# Patient Record
Sex: Male | Born: 1940 | Race: White | Hispanic: No | Marital: Married | Smoking: Former smoker
Health system: Southern US, Community
[De-identification: ages and names within clinical notes are randomized; demographics above are authoritative.]

## PROBLEM LIST (undated history)

## (undated) DIAGNOSIS — U071 COVID-19: Secondary | ICD-10-CM

## (undated) DIAGNOSIS — N289 Disorder of kidney and ureter, unspecified: Secondary | ICD-10-CM

## (undated) DIAGNOSIS — I4891 Unspecified atrial fibrillation: Secondary | ICD-10-CM

## (undated) DIAGNOSIS — K469 Unspecified abdominal hernia without obstruction or gangrene: Secondary | ICD-10-CM

## (undated) HISTORY — PX: BACK SURGERY: SHX140

## (undated) HISTORY — PX: HERNIA REPAIR: SHX51

## (undated) HISTORY — PX: APPENDECTOMY: SHX54

## (undated) HISTORY — PX: SHOULDER OPEN ROTATOR CUFF REPAIR: SHX2407

---

## 2013-11-06 DIAGNOSIS — N183 Chronic kidney disease, stage 3 unspecified: Secondary | ICD-10-CM | POA: Diagnosis present

## 2013-11-06 DIAGNOSIS — Q619 Cystic kidney disease, unspecified: Secondary | ICD-10-CM | POA: Insufficient documentation

## 2016-02-21 DIAGNOSIS — I480 Paroxysmal atrial fibrillation: Secondary | ICD-10-CM | POA: Insufficient documentation

## 2017-04-26 DIAGNOSIS — E785 Hyperlipidemia, unspecified: Secondary | ICD-10-CM | POA: Insufficient documentation

## 2017-04-26 DIAGNOSIS — I10 Essential (primary) hypertension: Secondary | ICD-10-CM | POA: Insufficient documentation

## 2018-08-05 ENCOUNTER — Other Ambulatory Visit: Payer: Self-pay | Admitting: Optometrist

## 2018-08-05 DIAGNOSIS — H539 Unspecified visual disturbance: Secondary | ICD-10-CM

## 2019-07-17 DIAGNOSIS — C61 Malignant neoplasm of prostate: Secondary | ICD-10-CM | POA: Insufficient documentation

## 2020-02-21 DIAGNOSIS — I2693 Single subsegmental pulmonary embolism without acute cor pulmonale: Secondary | ICD-10-CM | POA: Diagnosis present

## 2020-03-04 ENCOUNTER — Emergency Department (HOSPITAL_COMMUNITY): Admission: EM | Admit: 2020-03-04 | Payer: Self-pay

## 2020-03-04 ENCOUNTER — Other Ambulatory Visit (HOSPITAL_COMMUNITY): Payer: Medicare HMO

## 2020-03-04 ENCOUNTER — Inpatient Hospital Stay
Admission: EM | Admit: 2020-03-04 | Discharge: 2020-03-22 | Disposition: A | Discharge status: Skilled Nursing Facility | Payer: Medicare HMO | Source: Other Acute Inpatient Hospital | Attending: Internal Medicine | Admitting: Internal Medicine

## 2020-03-04 DIAGNOSIS — J189 Pneumonia, unspecified organism: Secondary | ICD-10-CM

## 2020-03-04 DIAGNOSIS — J9690 Respiratory failure, unspecified, unspecified whether with hypoxia or hypercapnia: Secondary | ICD-10-CM

## 2020-03-04 DIAGNOSIS — U071 COVID-19: Secondary | ICD-10-CM

## 2020-03-04 LAB — COMPREHENSIVE METABOLIC PANEL
ALT: 187 U/L — ABNORMAL HIGH (ref 0–44)
AST: 91 U/L — ABNORMAL HIGH (ref 15–41)
Albumin: 2.3 g/dL — ABNORMAL LOW (ref 3.5–5.0)
Alkaline Phosphatase: 263 U/L — ABNORMAL HIGH (ref 38–126)
Anion gap: 10 (ref 5–15)
BUN: 16 mg/dL (ref 8–23)
CO2: 30 mmol/L (ref 22–32)
Calcium: 9.4 mg/dL (ref 8.9–10.3)
Chloride: 95 mmol/L — ABNORMAL LOW (ref 98–111)
Creatinine, Ser: 1.12 mg/dL (ref 0.61–1.24)
GFR calc Af Amer: >60 mL/min (ref >60)
GFR calc non Af Amer: >60 mL/min (ref >60)
Glucose, Bld: 115 mg/dL — ABNORMAL HIGH (ref 70–99)
Potassium: 4.3 mmol/L (ref 3.5–5.1)
Sodium: 135 mmol/L (ref 135–145)
Total Bilirubin: 0.5 mg/dL (ref 0.3–1.2)
Total Protein: 6.5 g/dL (ref 6.5–8.1)

## 2020-03-04 LAB — CBC WITH DIFFERENTIAL/PLATELET
Abs Immature Granulocytes: 0.47 10*3/uL — ABNORMAL HIGH (ref 0.00–0.07)
Basophils Absolute: 0.1 10*3/uL (ref 0.0–0.1)
Basophils Relative: 1 %
Eosinophils Absolute: 0 10*3/uL (ref 0.0–0.5)
Eosinophils Relative: 0 %
HCT: 40.8 % (ref 39.0–52.0)
Hemoglobin: 13.3 g/dL (ref 13.0–17.0)
Immature Granulocytes: 4 %
Lymphocytes Relative: 5 %
Lymphs Abs: 0.6 10*3/uL — ABNORMAL LOW (ref 0.7–4.0)
MCH: 32 pg (ref 26.0–34.0)
MCHC: 32.6 g/dL (ref 30.0–36.0)
MCV: 98.1 fL (ref 80.0–100.0)
Monocytes Absolute: 1.1 10*3/uL — ABNORMAL HIGH (ref 0.1–1.0)
Monocytes Relative: 8 %
Neutro Abs: 11 10*3/uL — ABNORMAL HIGH (ref 1.7–7.7)
Neutrophils Relative %: 82 %
Platelets: 217 10*3/uL (ref 150–400)
RBC: 4.16 MIL/uL — ABNORMAL LOW (ref 4.22–5.81)
RDW: 13.2 % (ref 11.5–15.5)
WBC: 13.3 10*3/uL — ABNORMAL HIGH (ref 4.0–10.5)
nRBC: 0 % (ref 0.0–0.2)

## 2020-03-04 LAB — MAGNESIUM: Magnesium: 2 mg/dL (ref 1.7–2.4)

## 2020-03-04 LAB — PROTIME-INR
INR: 1.1 (ref 0.8–1.2)
Prothrombin Time: 13.5 seconds (ref 11.4–15.2)

## 2020-03-04 LAB — PHOSPHORUS: Phosphorus: 3.2 mg/dL (ref 2.5–4.6)

## 2020-03-05 LAB — URINALYSIS, ROUTINE W REFLEX MICROSCOPIC
Bilirubin Urine: NEGATIVE
Glucose, UA: NEGATIVE mg/dL
Ketones, ur: NEGATIVE mg/dL
Nitrite: NEGATIVE
Protein, ur: NEGATIVE mg/dL
RBC / HPF: >50 RBC/hpf — ABNORMAL HIGH (ref 0–5)
Specific Gravity, Urine: 1.008 (ref 1.005–1.030)
WBC, UA: >50 WBC/hpf — ABNORMAL HIGH (ref 0–5)
pH: 6 (ref 5.0–8.0)

## 2020-03-06 LAB — EXPECTORATED SPUTUM ASSESSMENT W GRAM STAIN, RFLX TO RESP C

## 2020-03-06 LAB — BLOOD GAS, ARTERIAL
Acid-Base Excess: 2.1 mmol/L — ABNORMAL HIGH (ref 0.0–2.0)
Bicarbonate: 25.6 mmol/L (ref 20.0–28.0)
FIO2: 36
O2 Saturation: 98.5 %
Patient temperature: 36.4
pCO2 arterial: 35 mmHg (ref 32.0–48.0)
pH, Arterial: 7.475 — ABNORMAL HIGH (ref 7.350–7.450)
pO2, Arterial: 127 mmHg — ABNORMAL HIGH (ref 83.0–108.0)

## 2020-03-07 LAB — CBC
HCT: 38.6 % — ABNORMAL LOW (ref 39.0–52.0)
Hemoglobin: 12.9 g/dL — ABNORMAL LOW (ref 13.0–17.0)
MCH: 32.7 pg (ref 26.0–34.0)
MCHC: 33.4 g/dL (ref 30.0–36.0)
MCV: 97.7 fL (ref 80.0–100.0)
Platelets: 228 10*3/uL (ref 150–400)
RBC: 3.95 MIL/uL — ABNORMAL LOW (ref 4.22–5.81)
RDW: 13.2 % (ref 11.5–15.5)
WBC: 9.9 10*3/uL (ref 4.0–10.5)
nRBC: 0 % (ref 0.0–0.2)

## 2020-03-07 LAB — COMPREHENSIVE METABOLIC PANEL
ALT: 332 U/L — ABNORMAL HIGH (ref 0–44)
AST: 150 U/L — ABNORMAL HIGH (ref 15–41)
Albumin: 2.5 g/dL — ABNORMAL LOW (ref 3.5–5.0)
Alkaline Phosphatase: 277 U/L — ABNORMAL HIGH (ref 38–126)
Anion gap: 13 (ref 5–15)
BUN: 23 mg/dL (ref 8–23)
CO2: 23 mmol/L (ref 22–32)
Calcium: 8.9 mg/dL (ref 8.9–10.3)
Chloride: 95 mmol/L — ABNORMAL LOW (ref 98–111)
Creatinine, Ser: 0.99 mg/dL (ref 0.61–1.24)
GFR calc Af Amer: >60 mL/min (ref >60)
GFR calc non Af Amer: >60 mL/min (ref >60)
Glucose, Bld: 132 mg/dL — ABNORMAL HIGH (ref 70–99)
Potassium: 3.9 mmol/L (ref 3.5–5.1)
Sodium: 131 mmol/L — ABNORMAL LOW (ref 135–145)
Total Bilirubin: 0.7 mg/dL (ref 0.3–1.2)
Total Protein: 6.3 g/dL — ABNORMAL LOW (ref 6.5–8.1)

## 2020-03-07 LAB — MAGNESIUM: Magnesium: 1.9 mg/dL (ref 1.7–2.4)

## 2020-03-08 LAB — CBC
HCT: 37 % — ABNORMAL LOW (ref 39.0–52.0)
Hemoglobin: 12.4 g/dL — ABNORMAL LOW (ref 13.0–17.0)
MCH: 33.2 pg (ref 26.0–34.0)
MCHC: 33.5 g/dL (ref 30.0–36.0)
MCV: 98.9 fL (ref 80.0–100.0)
Platelets: 212 10*3/uL (ref 150–400)
RBC: 3.74 MIL/uL — ABNORMAL LOW (ref 4.22–5.81)
RDW: 13.3 % (ref 11.5–15.5)
WBC: 7.6 10*3/uL (ref 4.0–10.5)
nRBC: 0 % (ref 0.0–0.2)

## 2020-03-08 LAB — URINE CULTURE: Culture: >=100000 — AB

## 2020-03-08 LAB — HEPATITIS B SURFACE ANTIGEN: Hepatitis B Surface Ag: NONREACTIVE

## 2020-03-08 LAB — HEPATITIS B CORE ANTIBODY, IGM: Hep B C IgM: NONREACTIVE

## 2020-03-09 LAB — HEPATITIS B E ANTIGEN: Hep B E Ag: NEGATIVE

## 2020-03-11 LAB — COMPREHENSIVE METABOLIC PANEL
ALT: 121 U/L — ABNORMAL HIGH (ref 0–44)
AST: 35 U/L (ref 15–41)
Albumin: 2.6 g/dL — ABNORMAL LOW (ref 3.5–5.0)
Alkaline Phosphatase: 178 U/L — ABNORMAL HIGH (ref 38–126)
Anion gap: 11 (ref 5–15)
BUN: 17 mg/dL (ref 8–23)
CO2: 26 mmol/L (ref 22–32)
Calcium: 8.8 mg/dL — ABNORMAL LOW (ref 8.9–10.3)
Chloride: 95 mmol/L — ABNORMAL LOW (ref 98–111)
Creatinine, Ser: 1.03 mg/dL (ref 0.61–1.24)
GFR calc Af Amer: >60 mL/min (ref >60)
GFR calc non Af Amer: >60 mL/min (ref >60)
Glucose, Bld: 118 mg/dL — ABNORMAL HIGH (ref 70–99)
Potassium: 3.1 mmol/L — ABNORMAL LOW (ref 3.5–5.1)
Sodium: 132 mmol/L — ABNORMAL LOW (ref 135–145)
Total Bilirubin: 0.8 mg/dL (ref 0.3–1.2)
Total Protein: 6.1 g/dL — ABNORMAL LOW (ref 6.5–8.1)

## 2020-03-11 LAB — CBC
HCT: 39.4 % (ref 39.0–52.0)
Hemoglobin: 13.2 g/dL (ref 13.0–17.0)
MCH: 33.7 pg (ref 26.0–34.0)
MCHC: 33.5 g/dL (ref 30.0–36.0)
MCV: 100.5 fL — ABNORMAL HIGH (ref 80.0–100.0)
Platelets: 306 10*3/uL (ref 150–400)
RBC: 3.92 MIL/uL — ABNORMAL LOW (ref 4.22–5.81)
RDW: 14.3 % (ref 11.5–15.5)
WBC: 7.2 10*3/uL (ref 4.0–10.5)
nRBC: 0 % (ref 0.0–0.2)

## 2020-03-11 LAB — MAGNESIUM: Magnesium: 1.9 mg/dL (ref 1.7–2.4)

## 2020-03-11 LAB — PHOSPHORUS: Phosphorus: 1.6 mg/dL — ABNORMAL LOW (ref 2.5–4.6)

## 2020-03-12 LAB — BASIC METABOLIC PANEL
Anion gap: 10 (ref 5–15)
BUN: 18 mg/dL (ref 8–23)
CO2: 25 mmol/L (ref 22–32)
Calcium: 8.8 mg/dL — ABNORMAL LOW (ref 8.9–10.3)
Chloride: 100 mmol/L (ref 98–111)
Creatinine, Ser: 1.01 mg/dL (ref 0.61–1.24)
GFR calc Af Amer: >60 mL/min (ref >60)
GFR calc non Af Amer: >60 mL/min (ref >60)
Glucose, Bld: 82 mg/dL (ref 70–99)
Potassium: 4.4 mmol/L (ref 3.5–5.1)
Sodium: 135 mmol/L (ref 135–145)

## 2020-03-12 LAB — MAGNESIUM: Magnesium: 1.9 mg/dL (ref 1.7–2.4)

## 2020-03-12 LAB — PHOSPHORUS: Phosphorus: 2.2 mg/dL — ABNORMAL LOW (ref 2.5–4.6)

## 2020-03-14 LAB — COMPREHENSIVE METABOLIC PANEL
ALT: 72 U/L — ABNORMAL HIGH (ref 0–44)
AST: 25 U/L (ref 15–41)
Albumin: 2.3 g/dL — ABNORMAL LOW (ref 3.5–5.0)
Alkaline Phosphatase: 107 U/L (ref 38–126)
Anion gap: 5 (ref 5–15)
BUN: 19 mg/dL (ref 8–23)
CO2: 29 mmol/L (ref 22–32)
Calcium: 8.8 mg/dL — ABNORMAL LOW (ref 8.9–10.3)
Chloride: 99 mmol/L (ref 98–111)
Creatinine, Ser: 1.15 mg/dL (ref 0.61–1.24)
GFR calc Af Amer: >60 mL/min (ref >60)
GFR calc non Af Amer: 59 mL/min — ABNORMAL LOW (ref >60)
Glucose, Bld: 102 mg/dL — ABNORMAL HIGH (ref 70–99)
Potassium: 4.3 mmol/L (ref 3.5–5.1)
Sodium: 133 mmol/L — ABNORMAL LOW (ref 135–145)
Total Bilirubin: 0.6 mg/dL (ref 0.3–1.2)
Total Protein: 5.3 g/dL — ABNORMAL LOW (ref 6.5–8.1)

## 2020-03-14 LAB — CBC
HCT: 35.3 % — ABNORMAL LOW (ref 39.0–52.0)
Hemoglobin: 11.6 g/dL — ABNORMAL LOW (ref 13.0–17.0)
MCH: 33.3 pg (ref 26.0–34.0)
MCHC: 32.9 g/dL (ref 30.0–36.0)
MCV: 101.4 fL — ABNORMAL HIGH (ref 80.0–100.0)
Platelets: 271 K/uL (ref 150–400)
RBC: 3.48 MIL/uL — ABNORMAL LOW (ref 4.22–5.81)
RDW: 15.6 % — ABNORMAL HIGH (ref 11.5–15.5)
WBC: 7 K/uL (ref 4.0–10.5)
nRBC: 0 % (ref 0.0–0.2)

## 2020-03-14 LAB — PHOSPHORUS: Phosphorus: 2.6 mg/dL (ref 2.5–4.6)

## 2020-03-14 LAB — MAGNESIUM: Magnesium: 1.9 mg/dL (ref 1.7–2.4)

## 2020-03-18 LAB — CBC
HCT: 37.3 % — ABNORMAL LOW (ref 39.0–52.0)
Hemoglobin: 12.6 g/dL — ABNORMAL LOW (ref 13.0–17.0)
MCH: 34.6 pg — ABNORMAL HIGH (ref 26.0–34.0)
MCHC: 33.8 g/dL (ref 30.0–36.0)
MCV: 102.5 fL — ABNORMAL HIGH (ref 80.0–100.0)
Platelets: 269 10*3/uL (ref 150–400)
RBC: 3.64 MIL/uL — ABNORMAL LOW (ref 4.22–5.81)
RDW: 18 % — ABNORMAL HIGH (ref 11.5–15.5)
WBC: 7.6 10*3/uL (ref 4.0–10.5)
nRBC: 0 % (ref 0.0–0.2)

## 2020-03-18 LAB — BASIC METABOLIC PANEL
Anion gap: 9 (ref 5–15)
BUN: 17 mg/dL (ref 8–23)
CO2: 27 mmol/L (ref 22–32)
Calcium: 9.1 mg/dL (ref 8.9–10.3)
Chloride: 102 mmol/L (ref 98–111)
Creatinine, Ser: 0.9 mg/dL (ref 0.61–1.24)
GFR calc Af Amer: >60 mL/min (ref >60)
GFR calc non Af Amer: >60 mL/min (ref >60)
Glucose, Bld: 123 mg/dL — ABNORMAL HIGH (ref 70–99)
Potassium: 3.5 mmol/L (ref 3.5–5.1)
Sodium: 138 mmol/L (ref 135–145)

## 2020-03-18 LAB — MAGNESIUM: Magnesium: 1.9 mg/dL (ref 1.7–2.4)

## 2020-03-19 ENCOUNTER — Other Ambulatory Visit (HOSPITAL_COMMUNITY): Payer: Medicare HMO

## 2020-03-22 LAB — NOVEL CORONAVIRUS, NAA (HOSP ORDER, SEND-OUT TO REF LAB; TAT 18-24 HRS): SARS-CoV-2, NAA: NOT DETECTED

## 2020-04-04 ENCOUNTER — Inpatient Hospital Stay (HOSPITAL_COMMUNITY)
Admission: EM | Admit: 2020-04-04 | Discharge: 2020-04-09 | DRG: 196 | Disposition: A | Discharge status: Home / Self Care | Payer: Medicare HMO | Attending: Student in an Organized Health Care Education/Training Program | Admitting: Student in an Organized Health Care Education/Training Program

## 2020-04-04 ENCOUNTER — Emergency Department (HOSPITAL_COMMUNITY): Payer: Medicare HMO

## 2020-04-04 ENCOUNTER — Encounter (HOSPITAL_COMMUNITY): Payer: Self-pay

## 2020-04-04 DIAGNOSIS — I2693 Single subsegmental pulmonary embolism without acute cor pulmonale: Secondary | ICD-10-CM | POA: Diagnosis present

## 2020-04-04 DIAGNOSIS — Z8249 Family history of ischemic heart disease and other diseases of the circulatory system: Secondary | ICD-10-CM | POA: Diagnosis not present

## 2020-04-04 DIAGNOSIS — J9601 Acute respiratory failure with hypoxia: Secondary | ICD-10-CM | POA: Diagnosis present

## 2020-04-04 DIAGNOSIS — Z8546 Personal history of malignant neoplasm of prostate: Secondary | ICD-10-CM

## 2020-04-04 DIAGNOSIS — Z20822 Contact with and (suspected) exposure to covid-19: Secondary | ICD-10-CM | POA: Diagnosis present

## 2020-04-04 DIAGNOSIS — E785 Hyperlipidemia, unspecified: Secondary | ICD-10-CM | POA: Diagnosis present

## 2020-04-04 DIAGNOSIS — U099 Post covid-19 condition, unspecified: Secondary | ICD-10-CM | POA: Diagnosis present

## 2020-04-04 DIAGNOSIS — B948 Sequelae of other specified infectious and parasitic diseases: Secondary | ICD-10-CM | POA: Diagnosis not present

## 2020-04-04 DIAGNOSIS — N183 Chronic kidney disease, stage 3 unspecified: Secondary | ICD-10-CM | POA: Diagnosis present

## 2020-04-04 DIAGNOSIS — R011 Cardiac murmur, unspecified: Secondary | ICD-10-CM | POA: Diagnosis not present

## 2020-04-04 DIAGNOSIS — Q613 Polycystic kidney, unspecified: Secondary | ICD-10-CM | POA: Diagnosis not present

## 2020-04-04 DIAGNOSIS — Z9079 Acquired absence of other genital organ(s): Secondary | ICD-10-CM

## 2020-04-04 DIAGNOSIS — Z87891 Personal history of nicotine dependence: Secondary | ICD-10-CM

## 2020-04-04 DIAGNOSIS — Z8616 Personal history of COVID-19: Secondary | ICD-10-CM | POA: Diagnosis present

## 2020-04-04 DIAGNOSIS — J8489 Other specified interstitial pulmonary diseases: Principal | ICD-10-CM | POA: Diagnosis present

## 2020-04-04 DIAGNOSIS — I4891 Unspecified atrial fibrillation: Secondary | ICD-10-CM | POA: Diagnosis present

## 2020-04-04 DIAGNOSIS — J479 Bronchiectasis, uncomplicated: Secondary | ICD-10-CM | POA: Diagnosis present

## 2020-04-04 DIAGNOSIS — G250 Essential tremor: Secondary | ICD-10-CM | POA: Diagnosis present

## 2020-04-04 DIAGNOSIS — J849 Interstitial pulmonary disease, unspecified: Secondary | ICD-10-CM | POA: Diagnosis present

## 2020-04-04 DIAGNOSIS — K219 Gastro-esophageal reflux disease without esophagitis: Secondary | ICD-10-CM | POA: Diagnosis present

## 2020-04-04 DIAGNOSIS — D509 Iron deficiency anemia, unspecified: Secondary | ICD-10-CM | POA: Diagnosis present

## 2020-04-04 DIAGNOSIS — Z86711 Personal history of pulmonary embolism: Secondary | ICD-10-CM | POA: Diagnosis not present

## 2020-04-04 DIAGNOSIS — R600 Localized edema: Secondary | ICD-10-CM | POA: Diagnosis not present

## 2020-04-04 DIAGNOSIS — R0781 Pleurodynia: Secondary | ICD-10-CM | POA: Diagnosis not present

## 2020-04-04 DIAGNOSIS — Z823 Family history of stroke: Secondary | ICD-10-CM

## 2020-04-04 DIAGNOSIS — U071 COVID-19: Secondary | ICD-10-CM | POA: Diagnosis not present

## 2020-04-04 DIAGNOSIS — Z79899 Other long term (current) drug therapy: Secondary | ICD-10-CM | POA: Diagnosis not present

## 2020-04-04 DIAGNOSIS — Z7901 Long term (current) use of anticoagulants: Secondary | ICD-10-CM | POA: Diagnosis not present

## 2020-04-04 DIAGNOSIS — Z7952 Long term (current) use of systemic steroids: Secondary | ICD-10-CM

## 2020-04-04 DIAGNOSIS — J189 Pneumonia, unspecified organism: Secondary | ICD-10-CM

## 2020-04-04 DIAGNOSIS — R0902 Hypoxemia: Secondary | ICD-10-CM

## 2020-04-04 DIAGNOSIS — I130 Hypertensive heart and chronic kidney disease with heart failure and stage 1 through stage 4 chronic kidney disease, or unspecified chronic kidney disease: Secondary | ICD-10-CM | POA: Diagnosis present

## 2020-04-04 DIAGNOSIS — F419 Anxiety disorder, unspecified: Secondary | ICD-10-CM | POA: Diagnosis present

## 2020-04-04 DIAGNOSIS — G3184 Mild cognitive impairment, so stated: Secondary | ICD-10-CM | POA: Diagnosis present

## 2020-04-04 DIAGNOSIS — R000 Tachycardia, unspecified: Secondary | ICD-10-CM | POA: Diagnosis not present

## 2020-04-04 HISTORY — DX: Disorder of kidney and ureter, unspecified: N28.9

## 2020-04-04 HISTORY — DX: Unspecified abdominal hernia without obstruction or gangrene: K46.9

## 2020-04-04 HISTORY — DX: COVID-19: U07.1

## 2020-04-04 HISTORY — DX: Unspecified atrial fibrillation: I48.91

## 2020-04-04 LAB — COMPREHENSIVE METABOLIC PANEL
ALT: 40 U/L (ref 0–44)
AST: 33 U/L (ref 15–41)
Albumin: 2.4 g/dL — ABNORMAL LOW (ref 3.5–5.0)
Alkaline Phosphatase: 58 U/L (ref 38–126)
Anion gap: 11 (ref 5–15)
BUN: 8 mg/dL (ref 8–23)
CO2: 24 mmol/L (ref 22–32)
Calcium: 9.2 mg/dL (ref 8.9–10.3)
Chloride: 103 mmol/L (ref 98–111)
Creatinine, Ser: 0.97 mg/dL (ref 0.61–1.24)
GFR calc Af Amer: >60 mL/min (ref >60)
GFR calc non Af Amer: >60 mL/min (ref >60)
Glucose, Bld: 96 mg/dL (ref 70–99)
Potassium: 4.2 mmol/L (ref 3.5–5.1)
Sodium: 138 mmol/L (ref 135–145)
Total Bilirubin: 1.2 mg/dL (ref 0.3–1.2)
Total Protein: 5.9 g/dL — ABNORMAL LOW (ref 6.5–8.1)

## 2020-04-04 LAB — RESPIRATORY PANEL BY RT PCR (FLU A&B, COVID)
Influenza A by PCR: NEGATIVE
Influenza B by PCR: NEGATIVE
SARS Coronavirus 2 by RT PCR: NEGATIVE

## 2020-04-04 LAB — CBC WITH DIFFERENTIAL/PLATELET
Abs Immature Granulocytes: 0.13 10*3/uL — ABNORMAL HIGH (ref 0.00–0.07)
Basophils Absolute: 0 10*3/uL (ref 0.0–0.1)
Basophils Relative: 0 %
Eosinophils Absolute: 0 10*3/uL (ref 0.0–0.5)
Eosinophils Relative: 0 %
HCT: 36.3 % — ABNORMAL LOW (ref 39.0–52.0)
Hemoglobin: 12 g/dL — ABNORMAL LOW (ref 13.0–17.0)
Immature Granulocytes: 1 %
Lymphocytes Relative: 4 %
Lymphs Abs: 0.4 10*3/uL — ABNORMAL LOW (ref 0.7–4.0)
MCH: 35.2 pg — ABNORMAL HIGH (ref 26.0–34.0)
MCHC: 33.1 g/dL (ref 30.0–36.0)
MCV: 106.5 fL — ABNORMAL HIGH (ref 80.0–100.0)
Monocytes Absolute: 0.8 10*3/uL (ref 0.1–1.0)
Monocytes Relative: 7 %
Neutro Abs: 9.3 10*3/uL — ABNORMAL HIGH (ref 1.7–7.7)
Neutrophils Relative %: 88 %
Platelets: 216 10*3/uL (ref 150–400)
RBC: 3.41 MIL/uL — ABNORMAL LOW (ref 4.22–5.81)
RDW: 18.5 % — ABNORMAL HIGH (ref 11.5–15.5)
WBC: 10.6 10*3/uL — ABNORMAL HIGH (ref 4.0–10.5)
nRBC: 0 % (ref 0.0–0.2)

## 2020-04-04 LAB — TROPONIN I (HIGH SENSITIVITY)
Troponin I (High Sensitivity): 19 ng/L — ABNORMAL HIGH (ref <18)
Troponin I (High Sensitivity): 20 ng/L — ABNORMAL HIGH (ref <18)
Troponin I (High Sensitivity): 19 ng/L — ABNORMAL HIGH (ref <18)

## 2020-04-04 LAB — PROTIME-INR
INR: 1 (ref 0.8–1.2)
Prothrombin Time: 13.1 seconds (ref 11.4–15.2)

## 2020-04-04 LAB — PROCALCITONIN: Procalcitonin: <0.1 ng/mL

## 2020-04-04 LAB — BRAIN NATRIURETIC PEPTIDE: B Natriuretic Peptide: 188.1 pg/mL — ABNORMAL HIGH (ref 0.0–100.0)

## 2020-04-04 MED ORDER — ACETAMINOPHEN 325 MG PO TABS
650.0000 mg | ORAL_TABLET | Freq: Once | ORAL | Status: AC
  Administered 2020-04-04: 650 mg via ORAL
  Filled 2020-04-04: qty 2

## 2020-04-04 MED ORDER — ENOXAPARIN SODIUM 60 MG/0.6ML ~~LOC~~ SOLN
1.0000 mg/kg | Freq: Two times a day (BID) | SUBCUTANEOUS | Status: DC
  Administered 2020-04-05 – 2020-04-09 (×9): 60 mg via SUBCUTANEOUS
  Filled 2020-04-04 (×12): qty 0.6

## 2020-04-04 MED ORDER — VANCOMYCIN HCL 500 MG/100ML IV SOLN
500.0000 mg | Freq: Two times a day (BID) | INTRAVENOUS | Status: DC
  Administered 2020-04-05: 500 mg via INTRAVENOUS
  Filled 2020-04-04 (×2): qty 100

## 2020-04-04 MED ORDER — ACETAMINOPHEN 325 MG PO TABS: 650.0000 mg | ORAL_TABLET | Freq: Four times a day (QID) | ORAL | Status: DC | PRN

## 2020-04-04 MED ORDER — VANCOMYCIN HCL IN DEXTROSE 1-5 GM/200ML-% IV SOLN
1000.0000 mg | Freq: Once | INTRAVENOUS | Status: AC
  Administered 2020-04-04: 1000 mg via INTRAVENOUS
  Filled 2020-04-04: qty 200

## 2020-04-04 MED ORDER — ENOXAPARIN SODIUM 40 MG/0.4ML ~~LOC~~ SOLN: 40.0000 mg | SUBCUTANEOUS | Status: DC

## 2020-04-04 MED ORDER — ENOXAPARIN SODIUM 60 MG/0.6ML ~~LOC~~ SOLN
1.0000 mg/kg | Freq: Two times a day (BID) | SUBCUTANEOUS | Status: DC
  Filled 2020-04-04 (×2): qty 0.6

## 2020-04-04 MED ORDER — SODIUM CHLORIDE 0.9 % IV SOLN
2.0000 g | Freq: Once | INTRAVENOUS | Status: AC
  Administered 2020-04-04: 2 g via INTRAVENOUS
  Filled 2020-04-04: qty 2

## 2020-04-04 MED ORDER — ACETAMINOPHEN 650 MG RE SUPP: 650.0000 mg | Freq: Four times a day (QID) | RECTAL | Status: DC | PRN

## 2020-04-04 MED ORDER — IOHEXOL 350 MG/ML SOLN
75.0000 mL | Freq: Once | INTRAVENOUS | Status: AC | PRN
  Administered 2020-04-04: 75 mL via INTRAVENOUS

## 2020-04-04 MED ORDER — ENOXAPARIN SODIUM 40 MG/0.4ML ~~LOC~~ SOLN
40.0000 mg | SUBCUTANEOUS | Status: DC
  Administered 2020-04-04: 40 mg via SUBCUTANEOUS
  Filled 2020-04-04: qty 0.4

## 2020-04-04 MED ORDER — ENOXAPARIN SODIUM 300 MG/3ML IJ SOLN
20.0000 mg | Freq: Once | INTRAMUSCULAR | Status: AC
  Administered 2020-04-04: 20 mg via SUBCUTANEOUS
  Filled 2020-04-04: qty 0.2

## 2020-04-04 MED ORDER — ALBUTEROL SULFATE HFA 108 (90 BASE) MCG/ACT IN AERS
1.0000 | INHALATION_SPRAY | RESPIRATORY_TRACT | Status: DC | PRN
  Filled 2020-04-04 (×2): qty 6.7

## 2020-04-04 NOTE — H&P (Addendum)
Date: 04/04/2020               Patient Name:  George Sandoval MRN: [id number]  DOB: August 10, 1940 Age / Sex: 79 y.o., male   PCP: Karleen Hampshire., MD         Medical Service: Internal Medicine Teaching Service         Attending Physician: Dr. Evette Doffing, Mallie Mussel, *    First Contact: Dr. Candie Chroman Pager: [phone]  Second Contact: Dr. Marianna Payment Pager: [phone]       After Hours (After 5p/  First Contact Pager: [phone]  weekends / holidays): Second Contact Pager: [phone]   Chief Complaint: Acute Hypoxic Respiratory Failure  History of Present Illness:  George Sandoval is an 79 yo male with a PMH A fib, Covid-19, migraine headaches, GERD, prostate cancer, CKD 3, Iron deficiency anemia, HTN, and polycystics kidney disease. Patient was diagnosed with Covid 19 02/08/2020 and is unvaccinated. He was admitted to [facility] 02/14/2020 for hypoxemia and discharged 02/17/2020 w/ 3L [place] and Decadron,which he completed at [facility] [place] in [facility], before his 2nd hospitalization 02/20/2020 for Acute hypoxic respiratory failure 2/2 Covid PNA. Of note patient had bilateral PNA's noted on CXR and a negative Pro-cal. Patient required High flow O2 at 10L [place]. Patient was noted to have a pulmonary embolism during this hospitalization with D-demer >20,000. Patient was discharged to [facility] on 5L [place]. Patient was discharged home from [facility] [address] with no O2 requirement per patient and patient wife. Patient was also recommended to get MRCP while in LTAC after LFTs were noted to be elevated and RUQ [place] found biliary duct dilation slightly greater than expected from postcholecystectomy. No evidence that MRCP occurred. Patient was discharged on lovenox and reported being taught to administer them at home. At this admission, patient reports that he suddenly felt dyspneic that worsened on exertion beginning 9/25. Patient reports that he was having some chest pain that felt like "stretching across anterior chest" that worsened with deep breaths.  He reports that he had this pain on Saturday and Sunday. Patient reports that his home O2 sat was in the 50s on Saturday after walking across the room and took a while to get into the 60s. He has not has cough or new sputum production associated. Patient also reports no diarrhea or abdominal pain with hew onset SOB.  The patient reports that he has also had increased bilateral lower extremity edema over the past week. He has been wearing support hosiery due to this. Patient reports good urine output and is s/p prostatectomy.   ED course: Patient recieved vanc and cefepime. Received CTA negative for PE and CXR positive for diffuse bilateral opacities.   Meds:  Albuterol q4 PRN Sumitripatan 100mg  PRN, max 2 doses in 24h ASA 81mg  Primidone 50 TID Omeprazole 40mg  Verapamil 120mg  daily Vit b12 BID remeron 15mg  qhs pepcid 20mg  BID setraline 50mg  Fish oil 12.11mL daily Lovenox BID Lovenox  No outpatient medications have been marked as taking for the 04/04/20 encounter [facility] Encounter).     Allergies: Allergies as of 04/04/2020 - Review Complete 04/04/2020  Allergen Reaction Noted  . Nsaids  02/14/2020  . Acetaminophen-codeine Other (See Comments) 02/20/2016  . Ibuprofen Other (See Comments) 11/24/2013  . Ipratropium bromide Other (See Comments) 06/22/2015  . Meloxicam Other (See Comments) 11/18/2006  . Metronidazole Nausea And Vomiting 11/24/2013  . Other Other (See Comments) 11/24/2013  . Sulindac Other (See Comments) 11/24/2013   Past Medical History:  Diagnosis Date  . Atrial fibrillation ([place])   . COVID-19   . Hernia of abdominal cavity   . Renal disorder     Family History: Father: Heart dx, HTN, CVA Mother: Migraines Brother: Cancer?  Social History: Former smoker smoked approx 10 years. Patient reported smoking up to 3ppd. No EtOH use.  Review of Systems: A complete ROS was negative except as per HPI.   Physical Exam: Blood pressure 119/74, pulse 83,  temperature 97.8 F (36.6 C), resp. rate (!) 24, height 5\' 10"  (1.778 m), weight 61.2 kg, SpO2 97 %. Physical Exam Constitutional:      General: He is not in acute distress. HENT:     Head: Normocephalic.  Cardiovascular:     Rate and Rhythm: Normal rate and regular rhythm.  Pulmonary:     Breath sounds: Examination of the right-lower field reveals rhonchi. Examination of the left-lower field reveals rhonchi. Rhonchi present.     Comments: Increased effort breathing on 5L [place] satting>90%. Patient able to talk but has noticeable breathlessness.   Poor chest expansion and decreased lung volume Abdominal:     General: Bowel sounds are normal.     Palpations: Abdomen is soft.     Tenderness: There is no abdominal tenderness.     Comments: Distended  Musculoskeletal:     Right lower leg: Edema present.     Left lower leg: Edema present.     Comments: 2+ pitting edema to the shin  Skin:    General: Skin is warm and dry.  Neurological:     General: No focal deficit present.     Mental Status: He is alert.     EKG: personally reviewed my interpretation is NSR, small T wave inversions in II, III, and avF  CXR: personally reviewed my interpretation is diffuse bilateral opacities with significant opacity noted in the R middle lobe. No bony abnormalities. Costophrenic angles noted. Stable cardiomediastinal contours.   Assessment & Plan by Problem: Active Problems:   Hypoxia  Acute hypoxic Respiratory failure Patient was diagnosed with Covid-19 02/08/2020 after 2 hospitalizations and a stay in LTAC patient reported going home on RA. Patient also has a recent hx of PE in 02/2020. Patient has 2 negative Covid 19 tests (9/12, 9/27-admission). CTA at this admission noted no demonstrable PE or Thoracic aortic aneurysm. Patient requires 5L [place] to sat >90% on RA at rest during interview. Despite CXR noting diffuse bilateral interstitial opacities with increased confluence in the right mid lung and CT  noting multifocal airspace opacity there is decreased concern for atypical PNA. Patient lungs look typical for recent Covid 19 dx.  Patient procal is unremarkable. RSV, Flu A, and Flu B were negative.  Patient has a mild leukocytosis 10.6 baseline appears to be around 7. Abs neturophil was elevated. Concern that hypoxia is 2/2 to CHF exacerbation. Patient does not have hx of CHF. BNP is 188.1 and patient has acute onset bilateral lower extremity pitting edema. Bedside US does not show significantly dilated IVC and showed good respiratory variation. - Admit to Med- tele, IMTS, Dr. Evette Doffing - Pulse ox, continuous - supplemental O2 - Tele - Reassess in the AM may restart abx - f/u AM CBC and AM CMP   Acute onset bilateral LE edema CTA chest noted small newly formed bilateral pleural effusions. Patient also reports that he has had  acute onset bilateral pitting edema. BNP is elevated at 188.1.  - Continue support hose -F/u TEE  Pleuritic type chest pain  Patient reported pleuritic type chest pain sensation during the first 2 days of new dyspnea. He notes increased pain with deep breaths. Decreased concern for PE as patient CTA was negative. Patient was noted to have significant amount of fibrosis and multifocal infiltrates that could cause pain with breathing. Trp 19>20. PT/INR 13.1/1.0. Patient is afebrile. - Repeat Trp x 2  A fib Patient home medication is verapamil.  - Continue verapamil - Continuous tele - Full dose anticoagulation with Lovenox  HLD Patient was taking statin, but it was discontinued at last discharge due to elevated LFTs. LFTs at this admission are WNL. We will continue to hold statins.   Dispo: Admit patient to Inpatient with expected length of stay greater than 2 midnights.  Signed: Freida Busman, MD 04/04/2020, 4:25 PM  Pager: [phone] After 5pm on weekdays and 1pm on weekends: On Call pager: [phone]

## 2020-04-04 NOTE — ED Triage Notes (Signed)
Pt arrived via [facility] for c/o dyspnea w/exertion and hypoxia. Pt told EMS his Sa02 was 56% on RA after he walked from bedroom to living room. Pt was COVID + at the beginning of Aug. Pt is A&Ox4. Pt is 82% on RA.

## 2020-04-04 NOTE — ED Provider Notes (Signed)
Medical screening examination/treatment/procedure(s) were conducted as a shared visit with non-physician practitioner(s) and myself.  I personally evaluated the patient during the encounter. Briefly, the patient is a 79 y.o. male with no significant medical history presents to the ED with shortness of breath.  Patient hypoxic to room air in the 80s, was even lower with EMS in the 70s.  On 2 L and comfortable with oxygen above 90%.  Recent admission for Covid pneumonia.  States that he has been off oxygen for several weeks.  Recently home from rehab.  Was not having any respiratory symptoms until yesterday when he developed shortness of breath again.  Worse with ambulation.  Has had leg swelling as well.  No history of heart failure or COPD.  Is not on a blood thinner.  Upon chart review it appears that patient did have CTA of the chest that showed small PEs but had some hemoptysis after starting blood thinner and was stopped.  Does not look like he was discharged on any blood thinners.  He is not having any chest pain.  EKG shows sinus rhythm.  He does have edema in his legs.  Will evaluate for cardiac process such as heart failure, ACS.  Will reevaluate for pneumonia.  However suspicious that this could be secondary to PE.  Will get lab work including chest x-ray and CT scan of the chest.  Will maybe touch base with pulmonary if needed about anticoagulation although it does not appear that he has a contraindication for.  Please see my physician Associates note for further results, evaluation, disposition of the patient.  This chart was dictated using voice recognition software.  Despite best efforts to proofread,  errors can occur which can change the documentation meaning.     EKG Interpretation  Date/Time:  Monday April 04 2020 10:55:48 EDT Ventricular Rate:  96 PR Interval:    QRS Duration: 112 QT Interval:  358 QTC Calculation: 453 R Axis:   -79 Text Interpretation: Sinus rhythm Probable left  atrial enlargement Incomplete right bundle branch block Inferolateral infarct, old Confirmed by Lennice Sites [phone]) on 04/04/2020 11:00:34 AM           Lennice Sites, DO 04/04/20 1103

## 2020-04-04 NOTE — ED Notes (Signed)
Pt Sa02 86% on 2L 02 per [place] at rest.

## 2020-04-04 NOTE — Hospital Course (Addendum)
Mr. George Sandoval is an 79 yo male who presented with acute onset hypoxia with a PMH A fib, Covid-19, migraine headaches, GERD, prostate cancer, CKD 3, Iron deficiency anemia, HTN, and polycystics kidney disease.   COVID19-associated Interstitial Lung Disease Pt CXR noted  new/worsening interstitial lung disease. Pulmonology was consulted due to acute nature of the ILD and patient's recent Covid diagnosis. 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 Recommendation included starting Solu-Medrol then transitioning to Prednisone 60 with a 6 week taper and follow-up. Patient was also placed on O2 and saw PT and OT who did not recommend followup. Patient titrated down and noted improvement with Prednisone. Patient was also expressed interest in the Covid vaccine and will attempt to receive outpatient.  Hx Provoked Pulmonary embolism Patient was taking Lovenox 40mg  at home due to recent PE, 8/14 provoked by Covid diagnosis. Repeat CTA this admission  showed resolution of PE. Pharmacy was consulted for full dose anticoagulation with lovenox and patient was increased to 60mg  BID. Attempted to investigate if patient could take [place] however, they interacted with his primidone. 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 Upon reviewing CTA at this admission it was felt that because his PE had resolved' 40mg  Lovenox was appropriate and patient could continue his home regimen.  A fib Patient was continued on his home verapamil.  Essential tremor Patient was continued on his home Primidone.  Anxiety Restarted patient's Zoloft and Remeron.  GERD Patient took famotidine while inpatient.

## 2020-04-04 NOTE — Progress Notes (Signed)
Pharmacy Antibiotic Note  George Sandoval is a 79 y.o. male admitted on 04/04/2020 with pneumonia.  Pharmacy has been consulted for vancomycin dosing. Pt is afebrile and WBC is minimally elevated at 10.6. Scr is WNL.   Plan: Vancomycin 1gm IV x 1 then 500mg  IV Q12H F/u renal fxn, C&S, clinical status and trough at [facility] F/u continuation of gram negative coverage  Height: 5\' 10"  (177.8 cm) Weight: 61.2 kg (135 lb) IBW/kg (Calculated) : 73  Temp (24hrs), Avg:97.8 F (36.6 C), Min:97.8 F (36.6 C), Max:97.8 F (36.6 C)  Recent Labs  Lab 04/04/20 1115  WBC 10.6*  CREATININE 0.97    Estimated Creatinine Clearance: 51.7 mL/min (by C-G formula based on SCr of 0.97 mg/dL).    Allergies  Allergen Reactions  . Acetaminophen-Codeine Other (See Comments)    Chest Pian/ pressure Chest Pian/ pressure   . Ibuprofen Other (See Comments)    Other reaction(s): HICCUPS UNKNOWN   . Ipratropium Bromide Other (See Comments)    Headache, nausea Headache, nausea   . Meloxicam Other (See Comments)    Chest Pain Chest Pain   . Metronidazole Nausea And Vomiting    Other reaction(s): NAUSEA,VOMITING   . Other Other (See Comments)    Other reaction(s): RENAL INSUFFICIENCY RENAL INSUFFICIENCY   . Sulindac Other (See Comments)    Other reaction(s): RECTAL BLEEDING     Antimicrobials this admission: Vanc 9/27>> Cefepime x 1 9/27  Dose adjustments this admission: N/A  Microbiology results: Pending  Thank you for allowing pharmacy to be a part of this patient's care.  Romulo Okray, Rande Lawman 04/04/2020 12:18 PM

## 2020-04-04 NOTE — ED Notes (Signed)
Placed pt on 6L 02 per [place]. Pt Sa02 is 92%

## 2020-04-04 NOTE — Progress Notes (Signed)
ANTICOAGULATION CONSULT NOTE - Initial Consult  Pharmacy Consult for lovenox  Indication: atrial fibrillation  Allergies  Allergen Reactions  . Nsaids     Other reaction(s): Unknown  . Acetaminophen-Codeine Other (See Comments)    Chest Pian/ pressure Chest Pian/ pressure   . Ibuprofen Other (See Comments)    Other reaction(s): HICCUPS UNKNOWN   . Ipratropium Bromide Other (See Comments)    Headache, nausea Headache, nausea   . Meloxicam Other (See Comments)    Chest Pain Chest Pain   . Metronidazole Nausea And Vomiting    Other reaction(s): NAUSEA,VOMITING   . Other Other (See Comments)    Other reaction(s): RENAL INSUFFICIENCY RENAL INSUFFICIENCY   . Sulindac Other (See Comments)    Other reaction(s): RECTAL BLEEDING     Patient Measurements: Height: 5\' 10"  (177.8 cm) Weight: 61.2 kg (135 lb) IBW/kg (Calculated) : 73  Vital Signs: Temp: 97.8 F (36.6 C) (09/27 1054) BP: 134/70 (09/27 1845) Pulse Rate: 84 (09/27 1900)  Labs: Recent Labs    04/04/20 1115 04/04/20 1150 04/04/20 1242 04/04/20 1750  HGB 12.0*  --   --   --   HCT 36.3*  --   --   --   PLT 216  --   --   --   LABPROT  --  13.1  --   --   INR  --  1.0  --   --   CREATININE 0.97  --   --   --   TROPONINIHS 19*  --  20* 19*    Estimated Creatinine Clearance: 51.7 mL/min (by C-G formula based on SCr of 0.97 mg/dL).   Medical History: Past Medical History:  Diagnosis Date  . Atrial fibrillation ([place])   . COVID-19   . Hernia of abdominal cavity   . Renal disorder     Medications:  Scheduled:  . enoxaparin (LOVENOX) injection  20 mg Subcutaneous Once  . [START ON 04/05/2020] enoxaparin (LOVENOX) injection  1 mg/kg Subcutaneous Q12H    Assessment: 79yoM on lovenox 40mg  BID PTA for history of PE. He tried apixaban prior to lovenox but developed hemoptysis and apixaban was d/cd. He also has a history of atrial fibrillation. Hemoglobin 12, PLT WNL ~200.  Pharmacy consulted for  full-dose anticoagulation with lovenox.  Goal of Therapy:  Monitor platelets by anticoagulation protocol: Yes   Plan:  Start Lovenox 60mg  every 12 hours Monitor daily CBC, signs/symptoms of bleeding  Mercy Riding, PharmD PGY1 Acute Care Pharmacy Resident Please refer to [facility] for unit-specific pharmacist

## 2020-04-04 NOTE — ED Notes (Signed)
Patient transported to CT 

## 2020-04-04 NOTE — ED Notes (Signed)
I put pt 02 on 5L and Sa02 94%

## 2020-04-04 NOTE — ED Provider Notes (Addendum)
[facility] EMERGENCY DEPARTMENT Provider Note   CSN: [id number] Arrival date & time: 04/04/20  1024     History Chief Complaint  Patient presents with  . Shortness of Breath    George Sandoval is a 79 y.o. male with a history of atrial fibrillation, hypertension, and prostate cancer who presents to the ED via EMS for evaluation of dyspnea that began yesterday.  Per chart review and discussion with the patient he tested positive for COVID-19 02/08/2020, was seen at [facility] and ultimately admitted to the hospital for acute hypoxic respiratory failure from 08/08-08/11 and subsequently discharged home on 3 L of oxygen via nasal cannula with a prescription for Decadron.  He completed a 10-day course of Decadron however he presented to the [facility] 02/20/2020 for worsening dyspnea-found to be hypoxic in the 80s on his 3 L nasal cannula with x-ray revealing bilateral pneumonia, ultimately had a CT angio which showed a small pulmonary embolism.  He was started on Eliquis but it appears this was discontinued due to development of hemoptysis.  He is not currently anticoagulated.  He was ultimately transitioned to [facility] facility 03/03/20 at [facility], he states that he was off of oxygen for a couple of weeks prior to being able to be discharged home 03/31/20. States he was feeling well at time of discharge,, however yesterday he started to feel a bit short of breath, this morning it seemed to acutely worsen, he checked his SPO2 and it was in the 50s therefore he called EMS.  Per EMS patient was 91% on room air, however with ambulation he dropped to 79%.  Oxygen was applied with improvement.  Patient denies chest pain, cough, fever, chills, syncope, abdominal pain, nausea, vomiting, or diaphoresis.  He is not currently taking a blood thinner.  He has had some intermittent bilateral lower extremity swelling since his hospital admission.  HPI     History reviewed.  No pertinent past medical history.  There are no problems to display for this patient.   History reviewed. No pertinent surgical history.     No family history on file.  Social History   Tobacco Use  . Smoking status: Not on file  Substance Use Topics  . Alcohol use: Not on file  . Drug use: Not on file    Home Medications Prior to Admission medications   Not on File    Allergies    Acetaminophen-codeine, Ibuprofen, Ipratropium bromide, Meloxicam, Metronidazole, Other, and Sulindac  Review of Systems   Review of Systems  Constitutional: Negative for chills, diaphoresis and fever.  Respiratory: Positive for shortness of breath. Negative for cough.   Cardiovascular: Positive for leg swelling. Negative for chest pain.  Gastrointestinal: Negative for abdominal pain, nausea and vomiting.  Genitourinary: Negative for dysuria.  Neurological: Negative for dizziness, syncope and light-headedness.  All other systems reviewed and are negative.   Physical Exam Updated Vital Signs BP 140/85   Pulse 89   Temp 97.8 F (36.6 C)   Resp 20   Ht 5\' 10"  (1.778 m)   Wt 61.2 kg   SpO2 (!) 82%   BMI 19.37 kg/m   Physical Exam Vitals and nursing note reviewed.  Constitutional:      General: He is not in acute distress.    Appearance: He is well-developed. He is not toxic-appearing.  HENT:     Head: Normocephalic and atraumatic.  Eyes:     General:  Right eye: No discharge.        Left eye: No discharge.     Conjunctiva/sclera: Conjunctivae normal.  Cardiovascular:     Rate and Rhythm: Normal rate and regular rhythm.  Pulmonary:     Breath sounds: Normal breath sounds. No wheezing, rhonchi or rales.     Comments: SPO2 desaturated to 82% on room air while sitting on the stretcher, ultimately applied 2 L via nasal cannula with improvement. Abdominal:     General: There is no distension.     Palpations: Abdomen is soft.     Tenderness: There is no abdominal  tenderness.  Musculoskeletal:     Cervical back: Neck supple.     Comments: 2+ symmetric pitting edema to the bilateral lower legs without overlying erythema/warmth or calf tenderness.  Skin:    General: Skin is warm and dry.     Findings: No rash.  Neurological:     Mental Status: He is alert.     Comments: Clear speech.   Psychiatric:        Behavior: Behavior normal.     ED Results / Procedures / Treatments   Labs (all labs ordered are listed, but only abnormal results are displayed) Labs Reviewed  COMPREHENSIVE METABOLIC PANEL - Abnormal; Notable for the following components:      Result Value   Total Protein 5.9 (*)    Albumin 2.4 (*)    All other components within normal limits  CBC WITH DIFFERENTIAL/PLATELET - Abnormal; Notable for the following components:   WBC 10.6 (*)    RBC 3.41 (*)    Hemoglobin 12.0 (*)    HCT 36.3 (*)    MCV 106.5 (*)    MCH 35.2 (*)    RDW 18.5 (*)    Neutro Abs 9.3 (*)    Lymphs Abs 0.4 (*)    Abs Immature Granulocytes 0.13 (*)    All other components within normal limits  BRAIN NATRIURETIC PEPTIDE - Abnormal; Notable for the following components:   B Natriuretic Peptide 188.1 (*)    All other components within normal limits  TROPONIN I (HIGH SENSITIVITY) - Abnormal; Notable for the following components:   Troponin I (High Sensitivity) 19 (*)    All other components within normal limits  TROPONIN I (HIGH SENSITIVITY) - Abnormal; Notable for the following components:   Troponin I (High Sensitivity) 20 (*)    All other components within normal limits  PROTIME-INR    EKG None  Radiology CT Angio Chest PE W/Cm &/Or Wo Cm  Result Date: 04/04/2020 CLINICAL DATA:  Shortness of breath.  Recent COVID-19 positive EXAM: CT ANGIOGRAPHY CHEST WITH CONTRAST TECHNIQUE: Multidetector CT imaging of the chest was performed using the standard protocol during bolus administration of intravenous contrast. Multiplanar CT image reconstructions and MIPs  were obtained to evaluate the vascular anatomy. CONTRAST:  86mL OMNIPAQUE IOHEXOL 350 MG/ML SOLN COMPARISON:  CT angiogram chest February 20, 2020; chest radiograph April 04, 2020 FINDINGS: Cardiovascular: There is no demonstrable pulmonary embolus. There is no thoracic aortic aneurysm or dissection. There are scattered foci of calcification in visualized great vessels. There are foci of aortic atherosclerosis. There are foci coronary artery calcification. There is no pericardial effusion or pericardial thickening. Mediastinum/Nodes: Thyroid appears normal. There are subcentimeter mediastinal lymph nodes but no adenopathy evident by size criteria in the thoracic region. No esophageal lesions are appreciable. Lungs/Pleura: There are free-flowing pleural effusions bilaterally, fairly small. There is multifocal airspace opacity superimposed on areas of  underlying fibrotic change. Extensive airspace opacity is new compared to the August 2021 study. There is a degree of lower lobe bronchiectatic change bilaterally which is stable. Upper Abdomen: There is upper abdominal aortic atherosclerosis. There are surgical clips in the gallbladder fossa. There is incomplete visualization of cysts arising in each kidney with the largest cyst in the upper pole right kidney region measuring approximately 3 x 3 cm. Musculoskeletal: No blastic or lytic bone lesions. No evident chest wall lesions. Review of the MIP images confirms the above findings. IMPRESSION: 1. No demonstrable pulmonary embolus. No thoracic aortic aneurysm or dissection. There is aortic atherosclerosis as well as foci of great vessel and coronary artery calcification. 2. Multifocal airspace opacity, likely due to atypical organism pneumonia, superimposed on a degree of underlying fibrosis. Appearance of the lung parenchyma is consistent with COVID-19 infection. Small pleural effusions noted bilaterally, new from prior study. 3.  Mild lower lobe bronchiectasis  bilaterally, stable. 4.  No evident adenopathy. 5.  Gallbladder absent. Aortic Atherosclerosis (ICD10-I70.0). Electronically Signed   By: Lowella Grip III M.D.   On: 04/04/2020 13:37   DG Chest Portable 1 View  Result Date: 04/04/2020 CLINICAL DATA:  Hypoxia, dyspnea history of COVID-19 infection EXAM: PORTABLE CHEST 1 VIEW COMPARISON:  03/19/2020, 03/04/2020 FINDINGS: Stable cardiomediastinal contours. Aortic atherosclerosis. Interval worsening of diffuse bilateral interstitial opacities, most confluent within the right mid lung. No pleural effusion or pneumothorax. Degenerative changes of the bilateral shoulders. IMPRESSION: Interval worsening of diffuse bilateral interstitial opacities, most confluent within the right mid lung. Electronically Signed   By: Davina Poke D.O.   On: 04/04/2020 11:53    Procedures .Critical Care Performed by: Amaryllis Dyke, PA-C Authorized by: Amaryllis Dyke, PA-C    CRITICAL CARE Performed by: Kennith Maes   Total critical care time: 35 minutes  Critical care time was exclusive of separately billable procedures and treating other patients.  Critical care was necessary to treat or prevent imminent or life-threatening deterioration.  Critical care was time spent personally by me on the following activities: development of treatment plan with patient and/or surrogate as well as nursing, discussions with consultants, evaluation of patient's response to treatment, examination of patient, obtaining history from patient or surrogate, ordering and performing treatments and interventions, ordering and review of laboratory studies, ordering and review of radiographic studies, pulse oximetry and re-evaluation of patient's condition.    (including critical care time)  Medications Ordered in ED Medications  vancomycin (VANCOCIN) IVPB 1000 mg/200 mL premix (1,000 mg Intravenous New Bag/Given 04/04/20 1350)  vancomycin (VANCOREADY) IVPB  500 mg/100 mL (has no administration in time range)  ceFEPIme (MAXIPIME) 2 g in sodium chloride 0.9 % 100 mL IVPB (0 g Intravenous Stopped 04/04/20 1318)  acetaminophen (TYLENOL) tablet 650 mg (650 mg Oral Given 04/04/20 1245)  iohexol (OMNIPAQUE) 350 MG/ML injection 75 mL (75 mLs Intravenous Contrast Given 04/04/20 1315)    ED Course  I have reviewed the triage vital signs and the nursing notes.  Pertinent labs & imaging results that were available during my care of the patient were reviewed by me and considered in my medical decision making (see chart for details).    MDM Rules/Calculators/A&P                         Patient presents to the ED with complaints of acute dyspnea since yesterday.  He is nontoxic, notably hypoxic, otherwise vitals unremarkable.  2+ symmetric pitting edema. Lungs  fairly clear.   Ddx: Pulmonary embolism, new onset CHF, pneumonia, critical anemia, pneumothorax, pleural effusion, ACS.   Additional history obtained:  Additional history obtained from EMS & chart review. Previous records obtained and reviewed - as detailed in  HPI.   EKG: No significant change compared to prior.  Lab Tests:  I Ordered, reviewed, and interpreted labs, which included:  CBC: Mild leukocytosis.  Anemia similar to prior. CMP: No significant electrolyte derangement. Troponin: Mildly elevated, flat BNP: Mildly elevated PT/INR: Within normal limits  Imaging Studies ordered:  I ordered imaging studies which included CXR & CTA of the chest, I independently visualized and interpreted imaging which showed pneumonia, pleural effusions, no PE.   Vancomycin and cefepime ordered for resistant pneumonia coverage, patient with new onset acute hypoxic respiratory failure as he had not been on oxygen at home after discharge from [place], will consult for admission. Patient & his wife are in agreement.   02:20: CONSULT: Discussed with internal medicine residency service- accept admission.    Findings and plan of care discussed with supervising physician Dr. Ronnald Nian who has evaluated patient as shared visit & is in agreement.    Portions of this note were generated with [employer]. Dictation errors may occur despite best attempts at proofreading.  Final Clinical Impression(s) / ED Diagnoses Final diagnoses:  Acute hypoxemic respiratory failure ([place])  Recurrent pneumonia    Rx / DC Orders ED Discharge Orders    None       Amaryllis Dyke, PA-C 04/04/20 [address], [place], PA-C 04/04/20 1444    Lennice Sites, DO 04/04/20 1502

## 2020-04-05 ENCOUNTER — Inpatient Hospital Stay (HOSPITAL_COMMUNITY): Payer: Medicare HMO

## 2020-04-05 DIAGNOSIS — E785 Hyperlipidemia, unspecified: Secondary | ICD-10-CM

## 2020-04-05 DIAGNOSIS — J9601 Acute respiratory failure with hypoxia: Secondary | ICD-10-CM | POA: Diagnosis not present

## 2020-04-05 DIAGNOSIS — I4891 Unspecified atrial fibrillation: Secondary | ICD-10-CM

## 2020-04-05 DIAGNOSIS — R0781 Pleurodynia: Secondary | ICD-10-CM

## 2020-04-05 DIAGNOSIS — R011 Cardiac murmur, unspecified: Secondary | ICD-10-CM

## 2020-04-05 DIAGNOSIS — Z7901 Long term (current) use of anticoagulants: Secondary | ICD-10-CM

## 2020-04-05 DIAGNOSIS — G250 Essential tremor: Secondary | ICD-10-CM

## 2020-04-05 DIAGNOSIS — R600 Localized edema: Secondary | ICD-10-CM | POA: Diagnosis not present

## 2020-04-05 DIAGNOSIS — B948 Sequelae of other specified infectious and parasitic diseases: Secondary | ICD-10-CM

## 2020-04-05 DIAGNOSIS — K219 Gastro-esophageal reflux disease without esophagitis: Secondary | ICD-10-CM

## 2020-04-05 DIAGNOSIS — F419 Anxiety disorder, unspecified: Secondary | ICD-10-CM

## 2020-04-05 DIAGNOSIS — Z8616 Personal history of COVID-19: Secondary | ICD-10-CM | POA: Diagnosis present

## 2020-04-05 DIAGNOSIS — R000 Tachycardia, unspecified: Secondary | ICD-10-CM

## 2020-04-05 LAB — COMPREHENSIVE METABOLIC PANEL
ALT: 37 U/L (ref 0–44)
AST: 18 U/L (ref 15–41)
Albumin: 2.2 g/dL — ABNORMAL LOW (ref 3.5–5.0)
Alkaline Phosphatase: 62 U/L (ref 38–126)
Anion gap: 10 (ref 5–15)
BUN: 10 mg/dL (ref 8–23)
CO2: 25 mmol/L (ref 22–32)
Calcium: 9 mg/dL (ref 8.9–10.3)
Chloride: 101 mmol/L (ref 98–111)
Creatinine, Ser: 0.98 mg/dL (ref 0.61–1.24)
GFR calc Af Amer: >60 mL/min (ref >60)
GFR calc non Af Amer: >60 mL/min (ref >60)
Glucose, Bld: 106 mg/dL — ABNORMAL HIGH (ref 70–99)
Potassium: 4.1 mmol/L (ref 3.5–5.1)
Sodium: 136 mmol/L (ref 135–145)
Total Bilirubin: 1.1 mg/dL (ref 0.3–1.2)
Total Protein: 5.6 g/dL — ABNORMAL LOW (ref 6.5–8.1)

## 2020-04-05 LAB — ECHOCARDIOGRAM COMPLETE
Area-P 1/2: 3.08 cm2
Height: 70 in
P 1/2 time: 438 msec
S' Lateral: 3.1 cm
Weight: 2160 oz

## 2020-04-05 LAB — CBC WITH DIFFERENTIAL/PLATELET
Abs Immature Granulocytes: 0.1 10*3/uL — ABNORMAL HIGH (ref 0.00–0.07)
Basophils Absolute: 0 10*3/uL (ref 0.0–0.1)
Basophils Relative: 0 %
Eosinophils Absolute: 0.1 10*3/uL (ref 0.0–0.5)
Eosinophils Relative: 1 %
HCT: 35.9 % — ABNORMAL LOW (ref 39.0–52.0)
Hemoglobin: 11.7 g/dL — ABNORMAL LOW (ref 13.0–17.0)
Immature Granulocytes: 1 %
Lymphocytes Relative: 6 %
Lymphs Abs: 0.5 10*3/uL — ABNORMAL LOW (ref 0.7–4.0)
MCH: 35 pg — ABNORMAL HIGH (ref 26.0–34.0)
MCHC: 32.6 g/dL (ref 30.0–36.0)
MCV: 107.5 fL — ABNORMAL HIGH (ref 80.0–100.0)
Monocytes Absolute: 0.5 10*3/uL (ref 0.1–1.0)
Monocytes Relative: 6 %
Neutro Abs: 8.2 10*3/uL — ABNORMAL HIGH (ref 1.7–7.7)
Neutrophils Relative %: 86 %
Platelets: 217 10*3/uL (ref 150–400)
RBC: 3.34 MIL/uL — ABNORMAL LOW (ref 4.22–5.81)
RDW: 18.1 % — ABNORMAL HIGH (ref 11.5–15.5)
WBC: 9.4 10*3/uL (ref 4.0–10.5)
nRBC: 0 % (ref 0.0–0.2)

## 2020-04-05 MED ORDER — FAMOTIDINE 20 MG PO TABS
20.0000 mg | ORAL_TABLET | Freq: Every day | ORAL | Status: DC
  Administered 2020-04-06 – 2020-04-09 (×4): 20 mg via ORAL
  Filled 2020-04-05 (×4): qty 1

## 2020-04-05 MED ORDER — VANCOMYCIN HCL IN DEXTROSE 1-5 GM/200ML-% IV SOLN: 1000.0000 mg | Freq: Once | INTRAVENOUS | Status: DC

## 2020-04-05 MED ORDER — MELATONIN 3 MG PO TABS
3.0000 mg | ORAL_TABLET | Freq: Every day | ORAL | Status: DC
  Administered 2020-04-05 – 2020-04-08 (×4): 3 mg via ORAL
  Filled 2020-04-05 (×6): qty 1

## 2020-04-05 MED ORDER — PRIMIDONE 50 MG PO TABS
150.0000 mg | ORAL_TABLET | Freq: Every day | ORAL | Status: DC
  Administered 2020-04-05 – 2020-04-08 (×4): 150 mg via ORAL
  Filled 2020-04-05 (×6): qty 3

## 2020-04-05 MED ORDER — VERAPAMIL HCL 120 MG PO TABS
120.0000 mg | ORAL_TABLET | Freq: Every day | ORAL | Status: DC
  Administered 2020-04-06 – 2020-04-09 (×4): 120 mg via ORAL
  Filled 2020-04-05 (×5): qty 1

## 2020-04-05 MED ORDER — SERTRALINE HCL 50 MG PO TABS
50.0000 mg | ORAL_TABLET | Freq: Every day | ORAL | Status: DC
  Administered 2020-04-06 – 2020-04-09 (×4): 50 mg via ORAL
  Filled 2020-04-05 (×5): qty 1

## 2020-04-05 MED ORDER — MIRTAZAPINE 15 MG PO TABS
7.5000 mg | ORAL_TABLET | Freq: Every day | ORAL | Status: DC
  Administered 2020-04-05 – 2020-04-08 (×4): 7.5 mg via ORAL
  Filled 2020-04-05 (×5): qty 1

## 2020-04-05 MED ORDER — SODIUM CHLORIDE 0.9 % IV SOLN
2.0000 g | Freq: Two times a day (BID) | INTRAVENOUS | Status: DC
  Administered 2020-04-05: 2 g via INTRAVENOUS
  Filled 2020-04-05: qty 2

## 2020-04-05 NOTE — ED Notes (Signed)
Pt up to the bathroom with assistance. Pt became dyspneic and oxygen saturations fell to 86%. Pt oxygen increased to 89%. Placed on NRB @15L  on top of 6L [place] with slow improvement to 93%. Will cont to monitor.

## 2020-04-05 NOTE — Progress Notes (Signed)
   04/05/20 2313  Vitals  Temp 98.6 F (37 C)  Temp Source Oral  BP 128/76  BP Location Right Arm  BP Method Automatic  Patient Position (if appropriate) Lying  Pulse Rate 78  ECG Heart Rate 80  Resp 20  Level of Consciousness  Level of Consciousness Alert  Oxygen Therapy  SpO2 92 %  O2 Device Nasal Cannula  O2 Flow Rate (L/min) 3 L/min  Patient Activity (if Appropriate) In bed  Pulse Oximetry Type Continuous  Pain Assessment  Pain Scale 0-10  Pain Score 0  Patient had a 15 beat run of Vtach. Patient is resting in bed when approached. Denies pain or discomfort.  Will continue to monitor.

## 2020-04-05 NOTE — ED Notes (Signed)
Echo remains at the bedside

## 2020-04-05 NOTE — Progress Notes (Signed)
  Echocardiogram 2D Echocardiogram has been performed.  George Sandoval 04/05/2020, 10:01 AM

## 2020-04-05 NOTE — Plan of Care (Signed)
  Problem: Education: Goal: Knowledge of disease or condition will improve Outcome: Progressing   Problem: Activity: Goal: Ability to tolerate increased activity will improve Outcome: Progressing   Problem: Respiratory: Goal: Ability to maintain a clear airway will improve Outcome: Progressing Goal: Levels of oxygenation will improve Outcome: Progressing Goal: Ability to maintain adequate ventilation will improve Outcome: Progressing   

## 2020-04-05 NOTE — Plan of Care (Signed)
  Problem: Nutrition: Goal: Adequate nutrition will be maintained Outcome: Completed/Met

## 2020-04-05 NOTE — ED Notes (Signed)
Pt moved to hospital bed for increased comfort.  Lunch tray give, spouse visiting

## 2020-04-05 NOTE — Progress Notes (Addendum)
Subjective: No acute overnight events. Patient continues to reports anterior chest pain with deep breaths. He also reports having significant SOB when using the urinal.   Objective:  Vital signs in last 24 hours: Vitals:   04/04/20 2200 04/05/20 0000 04/05/20 0200 04/05/20 0627  BP: 138/63 133/75 140/70 138/81  Pulse: 87 89 84 94  Resp: (!) 31 (!) 30 (!) 30 (!) 28  Temp:      SpO2: 93% 93% 94% 96%  Weight:      Height:       Physical Exam Constitutional:      Appearance: He is not ill-appearing.  HENT:     Head: Normocephalic and atraumatic.  Cardiovascular:     Rate and Rhythm: Regular rhythm. Tachycardia present.  Pulmonary:     Comments: Faint bibasilar crackles Increased work of breathing noted while patient is talking, but he is able to talk in complete sentences.  When weaning patient's O2 patient HR became tachy cardiac into there 120's. Did not go below 3L [place] when this happened. Patient HR returned to 80-90's when returned back to 5L [place]. Abdominal:     General: Bowel sounds are normal.     Palpations: Abdomen is soft.     Tenderness: There is no abdominal tenderness.  Neurological:     Mental Status: He is alert and oriented to person, place, and time.     Assessment/Plan:  Active Problems:   Hypoxia Acute hypoxic Respiratory failure Patient continues to do well on 5L [place] satting >90%. Patient leukocytosis has resolved and patient remains afebrile with stable BP. Patient is tachypnic this AM. HCO3 is 25. Etiology still to be determined at this time we have decreased suspicion for PE, and CHF. Patient had tachycardia when attempting to wean O2. He was able to keep sats above 90% at 3L [place], but HR was in the 120's. 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  - Pulse ox, continuous - supplemental O2, attempt to titrate down - Tele - f/u AM CBC  and AM CMP -f/u Bcx x2   Acute onset bilateral LE edema TTE noted Grade I Diastolic dysfunction. There was trivial mitral valve regurgitation. Mild aortic valve regurgitation and IVC was normal in size with appropriate respiratory variability. Extremities were noted to have minimal edema today.     Pleuritic type chest pain Patient continues to describe a pleuritic type chest pain with deep breaths. Chest discomfort has been found to last 2-3 months in patient who had severe Covid diagnosis. As this patient required ICU level care at one point in his course patient qualifies as a "Severe" Covid.    A fib Patient home medication is verapamil.  - Continue verapamil 120mg  - Continuous tele - Lovenox 60mg  q 12   Essential tremor Patient has hx of essential tremor and takes primidone. - Primidone 150qhs daily at breakfast  HLD Patient was taking statin, but it was discontinued at last discharge due to elevated LFTs. LFTs at this admission are WNL. We will continue to hold statins. LFTs remain WNL.  Anxiety Restart patient home Zoloft 50mg . Patient also was prescribed Remeron 7.5mg  qhs for anxiety and poor sleep during initial hospitalization for Covid. He has continued to take remeron at home - Continue home medications.   GERD Patient has a hx of GERD and takes 20mg  famotidine daily.  - Continue home  med  Prior to Admission Living Arrangement: Home Anticipated Discharge Location: Home Barriers to Discharge: Treatment Dispo: Anticipated discharge in approximately 1-2 day(s).   Freida Busman, MD 04/05/2020, 7:27 AM Pager: [phone] After 5pm on weekdays and 1pm on weekends: On Call pager [phone]

## 2020-04-05 NOTE — ED Notes (Signed)
Pt oxygen 6L [place] with saturations of 94%.

## 2020-04-06 ENCOUNTER — Inpatient Hospital Stay (HOSPITAL_COMMUNITY): Payer: Medicare HMO

## 2020-04-06 DIAGNOSIS — K219 Gastro-esophageal reflux disease without esophagitis: Secondary | ICD-10-CM | POA: Diagnosis not present

## 2020-04-06 DIAGNOSIS — Z86711 Personal history of pulmonary embolism: Secondary | ICD-10-CM

## 2020-04-06 DIAGNOSIS — J9601 Acute respiratory failure with hypoxia: Secondary | ICD-10-CM

## 2020-04-06 DIAGNOSIS — G250 Essential tremor: Secondary | ICD-10-CM | POA: Diagnosis not present

## 2020-04-06 DIAGNOSIS — I4891 Unspecified atrial fibrillation: Secondary | ICD-10-CM | POA: Diagnosis not present

## 2020-04-06 DIAGNOSIS — I2693 Single subsegmental pulmonary embolism without acute cor pulmonale: Secondary | ICD-10-CM

## 2020-04-06 DIAGNOSIS — N183 Chronic kidney disease, stage 3 unspecified: Secondary | ICD-10-CM

## 2020-04-06 LAB — CBC WITH DIFFERENTIAL/PLATELET
Abs Immature Granulocytes: 0.13 10*3/uL — ABNORMAL HIGH (ref 0.00–0.07)
Basophils Absolute: 0 10*3/uL (ref 0.0–0.1)
Basophils Relative: 0 %
Eosinophils Absolute: 0.1 10*3/uL (ref 0.0–0.5)
Eosinophils Relative: 1 %
HCT: 35.5 % — ABNORMAL LOW (ref 39.0–52.0)
Hemoglobin: 11.4 g/dL — ABNORMAL LOW (ref 13.0–17.0)
Immature Granulocytes: 2 %
Lymphocytes Relative: 7 %
Lymphs Abs: 0.6 10*3/uL — ABNORMAL LOW (ref 0.7–4.0)
MCH: 33.7 pg (ref 26.0–34.0)
MCHC: 32.1 g/dL (ref 30.0–36.0)
MCV: 105 fL — ABNORMAL HIGH (ref 80.0–100.0)
Monocytes Absolute: 0.6 10*3/uL (ref 0.1–1.0)
Monocytes Relative: 7 %
Neutro Abs: 6.9 10*3/uL (ref 1.7–7.7)
Neutrophils Relative %: 83 %
Platelets: 273 10*3/uL (ref 150–400)
RBC: 3.38 MIL/uL — ABNORMAL LOW (ref 4.22–5.81)
RDW: 17.5 % — ABNORMAL HIGH (ref 11.5–15.5)
WBC: 8.3 10*3/uL (ref 4.0–10.5)
nRBC: 0 % (ref 0.0–0.2)

## 2020-04-06 LAB — SEDIMENTATION RATE: Sed Rate: 99 mm/hr — ABNORMAL HIGH (ref 0–16)

## 2020-04-06 LAB — MAGNESIUM: Magnesium: 1.7 mg/dL (ref 1.7–2.4)

## 2020-04-06 LAB — PROCALCITONIN: Procalcitonin: <0.1 ng/mL

## 2020-04-06 LAB — C-REACTIVE PROTEIN: CRP: 19.6 mg/dL — ABNORMAL HIGH (ref <1.0)

## 2020-04-06 MED ORDER — CALCIUM CARBONATE ANTACID 500 MG PO CHEW: 2.0000 | CHEWABLE_TABLET | Freq: Every day | ORAL | Status: DC | PRN

## 2020-04-06 MED ORDER — METHYLPREDNISOLONE SODIUM SUCC 40 MG IJ SOLR
40.0000 mg | Freq: Two times a day (BID) | INTRAMUSCULAR | Status: DC
  Administered 2020-04-06 – 2020-04-08 (×4): 40 mg via INTRAVENOUS
  Filled 2020-04-06 (×4): qty 1

## 2020-04-06 NOTE — Consult Note (Signed)
NAME:  George Sandoval, MRN:  [id number], DOB:  04-Dec-1940, LOS: 2 ADMISSION DATE:  04/04/2020, CONSULTATION DATE:  04/06/2020 REFERRING MD:  Lucile Shutters MD, CHIEF COMPLAINT: Acute respiratory failure, post COVID-19  Brief History   79 year old with history of atrial fibrillation, GERD, prostate cancer, CKD, polycystic kidney disease, hypertension.  Diagnosed with COVID-19 on 02/08/2020.  Admitted to [facility] and treated with Decadron, 2 to 3 L oxygen.  He did not require intubation.  Discharged on 8/11 to [facility] and eventually went home.  Had a CTA on 8/14 which showed small PE in the right lower lobe. He was able to wean himself off oxygen but readmitted on 9/27 with worsening dyspnea, hypoxia. He had been on prednisone 40 mg after the day of admission.  Past Medical History    has a past medical history of Atrial fibrillation ([place]), COVID-19, Hernia of abdominal cavity, and Renal disorder.   He is a 30-pack-year smoker.  Quit in 1972.  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  No mold, hot tub, Jacuzzi Denies any arthritis, morning stiffness, Raynaud's phenomenon or rash.  Significant Hospital Events   9/29-admit  Consults:  PCCM  Procedures:    Significant Diagnostic Tests:  CTA 04/04/2020-no PE, multifocal airspace disease, small pleural effusion, traction bronchiectasis in the lower lobes.  I have reviewed the images personally.  Micro Data:  Blood culture 9/27, 9/28 >   Antimicrobials:    Interim history/subjective:    Objective   Blood pressure 120/62, pulse 96, temperature 98 F (36.7 C), temperature source Oral, resp. rate 18, height 5\' 10"  (1.778 m), weight 59.5 kg, SpO2 96 %.        Intake/Output Summary (Last 24 hours) at 04/06/2020 1336 Last data filed at 04/06/2020 0700 Gross per 24 hour  Intake 360 ml  Output 975 ml  Net -615 ml   Filed Weights   04/04/20 1056  04/05/20 1818  Weight: 61.2 kg 59.5 kg    Examination: Gen:      No acute distress HEENT:  EOMI, sclera anicteric Neck:     No masses; no thyromegaly Lungs:    Bibasal crackles CV:         Regular rate and rhythm; no murmurs Abd:      + bowel sounds; soft, non-tender; no palpable masses, no distension Ext:    No edema; adequate peripheral perfusion Skin:      Warm and dry; no rash Neuro: alert and oriented x 3   Resolved Hospital Problem list     Assessment & Plan:  Acute hypoxic respiratory failure Recent COVID-19 pneumonia CT scan reviewed with persistent bilateral pulmonary infiltrates Likely has post Covid pneumonitis, organizing pneumonia. No evidence of exposures, CTD ILD.  Low suspicion for bacterial pneumonia  Start Solu-Medrol 40 mg twice daily.  He will require a prolonged slow taper over several months.   Discussed with patient and wife.  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 We will need high-res CT and PFTs as an outpatient  Best practice:   Per primary  Labs   CBC: Recent Labs  Lab 04/04/20 1115 04/05/20 0421 04/06/20 0747  WBC 10.6* 9.4 8.3  NEUTROABS 9.3* 8.2* 6.9  HGB 12.0* 11.7* 11.4*  HCT 36.3* 35.9* 35.5*  MCV 106.5* 107.5* 105.0*  PLT 216 217 989    Basic Metabolic Panel: Recent Labs  Lab 04/04/20 1115  04/05/20 0421 04/06/20 0747  NA 138 136  --   K 4.2 4.1  --   CL 103 101  --   CO2 24 25  --   GLUCOSE 96 106*  --   BUN 8 10  --   CREATININE 0.97 0.98  --   CALCIUM 9.2 9.0  --   MG  --   --  1.7   GFR: Estimated Creatinine Clearance: 49.8 mL/min (by C-G formula based on SCr of 0.98 mg/dL). Recent Labs  Lab 04/04/20 1115 04/04/20 1242 04/05/20 0421 04/06/20 0747  PROCALCITON  --  <0.10  --   --   WBC 10.6*  --  9.4 8.3    Liver Function Tests: Recent Labs  Lab 04/04/20 1115 04/05/20 0421  AST 33 18  ALT 40 37  ALKPHOS 58 62  BILITOT 1.2 1.1  PROT 5.9* 5.6*  ALBUMIN 2.4* 2.2*   No  results for input(s): LIPASE, AMYLASE in the last 168 hours. No results for input(s): AMMONIA in the last 168 hours.  ABG    Component Value Date/Time   PHART 7.475 (H) 03/06/2020 1433   PCO2ART 35.0 03/06/2020 1433   PO2ART 127 (H) 03/06/2020 1433   HCO3 25.6 03/06/2020 1433   O2SAT 98.5 03/06/2020 1433     Coagulation Profile: Recent Labs  Lab 04/04/20 1150  INR 1.0    Cardiac Enzymes: No results for input(s): CKTOTAL, CKMB, CKMBINDEX, TROPONINI in the last 168 hours.  HbA1C: No results found for: HGBA1C  CBG: No results for input(s): GLUCAP in the last 168 hours.  Review of Systems:    REVIEW OF SYSTEMS:   All negative; except for those that are bolded, which indicate positives.  Constitutional: weight loss, weight gain, night sweats, fevers, chills, fatigue, weakness.  HEENT: headaches, sore throat, sneezing, nasal congestion, post nasal drip, difficulty swallowing, tooth/dental problems, visual complaints, visual changes, ear aches. Neuro: difficulty with speech, weakness, numbness, ataxia. CV:  chest pain, orthopnea, PND, swelling in lower extremities, dizziness, palpitations, syncope.  Resp: cough, hemoptysis, dyspnea, wheezing. GI: heartburn, indigestion, abdominal pain, nausea, vomiting, diarrhea, constipation, change in bowel habits, loss of appetite, hematemesis, melena, hematochezia.  GU: dysuria, change in color of urine, urgency or frequency, flank pain, hematuria. MSK: joint pain or swelling, decreased range of motion. Psych: change in mood or affect, depression, anxiety, suicidal ideations, homicidal ideations. Skin: rash, itching, bruising.  Past Medical History  He,  has a past medical history of Atrial fibrillation ([place]), COVID-19, Hernia of abdominal cavity, and Renal disorder.   Surgical History    Past Surgical History:  Procedure Laterality Date  . APPENDECTOMY    . BACK SURGERY    . HERNIA REPAIR    . SHOULDER OPEN ROTATOR CUFF REPAIR  Bilateral      Social History   reports that he has quit smoking. He has never used smokeless tobacco. He reports previous alcohol use. He reports that he does not use drugs.   Family History   His family history is not on file.   Allergies Allergies  Allergen Reactions  . Nsaids     Other reaction(s): Unknown  . Acetaminophen-Codeine Other (See Comments)    Chest Pian/ pressure Chest Pian/ pressure   . Ibuprofen Other (See Comments)    Other reaction(s): HICCUPS UNKNOWN   . Ipratropium Bromide Other (See Comments)    Headache, nausea Headache, nausea   . Meloxicam Other (See Comments)    Chest Pain Chest Pain   .  Metronidazole Nausea And Vomiting    Other reaction(s): NAUSEA,VOMITING   . Other Other (See Comments)    Other reaction(s): RENAL INSUFFICIENCY RENAL INSUFFICIENCY   . Sulindac Other (See Comments)    Other reaction(s): RECTAL BLEEDING      Home Medications  Prior to Admission medications   Medication Sig Start Date End Date Taking? Authorizing Provider  albuterol (VENTOLIN HFA) 108 (90 Base) MCG/ACT inhaler Inhale 2 puffs into the lungs every 4 (four) hours as needed for wheezing or shortness of breath. 02/17/20  Yes [provider]  enoxaparin (LOVENOX) 40 MG/0.4ML injection Inject 40 mg into the skin every 12 (twelve) hours. 04/01/20  Yes [provider]  famotidine (PEPCID) 20 MG tablet Take 20 mg by mouth 2 (two) times daily.   Yes [provider]  mirtazapine (REMERON) 15 MG tablet Take 12.5 mg by mouth at bedtime.   Yes [provider]  Multiple Vitamins-Iron (MULTIVITAMIN/IRON PO) Take 1 tablet by mouth daily.   Yes [provider]  predniSONE (DELTASONE) 20 MG tablet Take 40 mg by mouth daily with breakfast.   Yes [provider]  primidone (MYSOLINE) 50 MG tablet Take 150 mg by mouth daily with breakfast.   Yes [provider]  sertraline (ZOLOFT) 50 MG tablet Take 50 mg by mouth  every evening.   Yes [provider]  sodium chloride (OCEAN) 0.65 % SOLN nasal spray Place 1 spray into both nostrils daily as needed for congestion.   Yes [provider]  verapamil (CALAN) 120 MG tablet Take 120 mg by mouth daily.   Yes [provider]     Critical care time: NA   Marshell Garfinkel MD [facility] Please see [email or web address] for pager details.  04/06/2020, 1:57 PM

## 2020-04-06 NOTE — Progress Notes (Addendum)
Subjective: Patient had no overnight events. He reports that before he was moved to the floor he attempted to use the bathroom rather than urinal container in his bed. He reported that nurses had to bring a wheelchair to get hit out of the bathroom because he became so short of breath going. Patient also reports that he continues to have chest pain with deep breaths. Patient continues to feel SOB and does not feel ready to leave the hospital.  Objective:  Vital signs in last 24 hours: Vitals:   04/06/20 0237 04/06/20 0548 04/06/20 0614 04/06/20 0857  BP: 101/66  104/61 (!) 88/58  Pulse: 80  87 96  Resp: $Remo'16 18 17 18  'wIIal$ Temp: 98.8 F (37.1 C)  98.6 F (37 C) 98 F (36.7 C)  TempSrc: Oral  Oral Oral  SpO2: 94% 91% 94% 96%  Weight:      Height:       Physical Exam Constitutional:      Appearance: He is not ill-appearing.  HENT:     Head: Normocephalic and atraumatic.  Cardiovascular:     Rate and Rhythm: Normal rate and regular rhythm.  Pulmonary:     Comments: Increased work of breathing noted, patient has increased RR and is breathing on 4L.   Patient did not have faint bibasilar crackles this AM Abdominal:     General: Bowel sounds are normal.     Palpations: Abdomen is soft.     Tenderness: There is no abdominal tenderness.  Neurological:     Mental Status: He is alert.    Assessment/Plan:  Principal Problem:   Acute hypoxemic respiratory failure (HCC) Active Problems:   Chronic kidney disease, stage III (moderate) (HCC)   Single subsegmental pulmonary embolism without acute cor pulmonale (HCC)   History of COVID-19  Acute Hypoxic Respiratory failure About 2 months out from initial Covid 19 pneumonia diagnosis.  Had some worsening in his respiratory status on Saturday.  Continues to be hypoxic, saturating well on 4-5 L via nasal cannula at rest, requires up to 15 L with exertion.  CT chest showed multifocal airspace opacities superimposed on underlying fibrosis with  some bronchiectasis as well.  Bacterial or new viral pneumonia seems unlikely given absence of cough, no leukocytosis, undetectable procalcitonin.  No signs of cardiogenic pulmonary edema given the rest of his exam and cardiac ultrasound findings.  Worsening pulmonary embolism ruled out with repeat CT imaging.  I do wonder about a post Covid organizing pneumonia or interstitial lung disease as the cause of his acute worsening and CT findings.  No history of rheumatologic disease, no environmental exposures to animals or birds.  No pulmonary function testing on file.  Currently orally treating him with supportive care, and see a need for antibiotics or diuretics.  If this is post Covid ILD, may benefit from empiric trial of steroids, will consult with pulmonology for their recommendations on this question. - Pulse ox, continuous - supplemental O2, attempt to titrate down -f/u Bcx x2, ngtd -f/u ANA w/ reflex -F/u ESR -f/u CRP  Pulmonary embolism: Diagnosed on August 14, this was provoked due to COVID-19.  Tolerating anticoagulation well with Lovenox 60 mg twice daily.  Reviewed CT angiogram this admission showed resolution of the PE.   Pleuritic type chest pain Patient continues to describe a pleuritic type chest pain with deep breaths. Chest discomfort has been found to last 2-3 months in patient who had severe Covid diagnosis. As this patient required ICU level care at  one point in his course patient qualifies as a "Severe" Covid.    A fib Patient home medication is verapamil.  - Continue verapamil 120mg  - Continuous tele - Lovenox 60mg  q 12   Essential tremor Patient has hx of essential tremor and takes primidone. - Primidone 150qhs daily at breakfast     Anxiety Restart patient home Zoloft 50mg . Patient also was prescribed Remeron 7.5mg  qhs for anxiety and poor sleep during initial hospitalization for Covid. He has continued to take remeron at home - Continue home medications.      GERD Patient has a hx of GERD and takes 20mg  famotidine daily.  - Continue home med  Prior to Admission Living Arrangement: Home Anticipated Discharge Location: Home Barriers to Discharge: Treatment Dispo: Anticipated discharge in approximately 1-2 day(s).   Freida Busman, MD 04/06/2020, 10:03 AM Pager: [phone] After 5pm on weekdays and 1pm on weekends: On Call pager [phone]

## 2020-04-07 ENCOUNTER — Inpatient Hospital Stay: Payer: Medicare HMO

## 2020-04-07 DIAGNOSIS — G250 Essential tremor: Secondary | ICD-10-CM | POA: Diagnosis not present

## 2020-04-07 DIAGNOSIS — J849 Interstitial pulmonary disease, unspecified: Secondary | ICD-10-CM

## 2020-04-07 DIAGNOSIS — I2693 Single subsegmental pulmonary embolism without acute cor pulmonale: Secondary | ICD-10-CM | POA: Diagnosis not present

## 2020-04-07 DIAGNOSIS — U071 COVID-19: Secondary | ICD-10-CM

## 2020-04-07 DIAGNOSIS — I4891 Unspecified atrial fibrillation: Secondary | ICD-10-CM | POA: Diagnosis not present

## 2020-04-07 LAB — CBC WITH DIFFERENTIAL/PLATELET
Abs Immature Granulocytes: 0.1 10*3/uL — ABNORMAL HIGH (ref 0.00–0.07)
Basophils Absolute: 0 10*3/uL (ref 0.0–0.1)
Basophils Relative: 0 %
Eosinophils Absolute: 0 10*3/uL (ref 0.0–0.5)
Eosinophils Relative: 0 %
HCT: 36 % — ABNORMAL LOW (ref 39.0–52.0)
Hemoglobin: 11.5 g/dL — ABNORMAL LOW (ref 13.0–17.0)
Immature Granulocytes: 1 %
Lymphocytes Relative: 4 %
Lymphs Abs: 0.3 10*3/uL — ABNORMAL LOW (ref 0.7–4.0)
MCH: 34.1 pg — ABNORMAL HIGH (ref 26.0–34.0)
MCHC: 31.9 g/dL (ref 30.0–36.0)
MCV: 106.8 fL — ABNORMAL HIGH (ref 80.0–100.0)
Monocytes Absolute: 0.1 10*3/uL (ref 0.1–1.0)
Monocytes Relative: 2 %
Neutro Abs: 7.1 10*3/uL (ref 1.7–7.7)
Neutrophils Relative %: 93 %
Platelets: 349 10*3/uL (ref 150–400)
RBC: 3.37 MIL/uL — ABNORMAL LOW (ref 4.22–5.81)
RDW: 17.2 % — ABNORMAL HIGH (ref 11.5–15.5)
WBC: 7.6 10*3/uL (ref 4.0–10.5)
nRBC: 0 % (ref 0.0–0.2)

## 2020-04-07 LAB — ANA W/REFLEX IF POSITIVE: Anti Nuclear Antibody (ANA): NEGATIVE

## 2020-04-07 NOTE — Progress Notes (Signed)
NAME:  George Sandoval, MRN:  [id number], DOB:  01-01-1941, LOS: 3 ADMISSION DATE:  04/04/2020, CONSULTATION DATE:  04/06/2020 REFERRING MD:  Lucile Shutters MD, CHIEF COMPLAINT: Acute respiratory failure, post COVID-19  Brief History   79 year old with history of atrial fibrillation, GERD, prostate cancer, CKD, polycystic kidney disease, hypertension.  Diagnosed with COVID-19 on 02/08/2020.  Admitted to [facility] and treated with Decadron, 2 to 3 L oxygen.  He did not require intubation.  Discharged on 8/11 to [facility] and eventually went home.  Had a CTA on 8/14 which showed small PE in the right lower lobe. He was able to wean himself off oxygen but readmitted on 9/27 with worsening dyspnea, hypoxia. He had been on prednisone 40 mg after the day of admission.  Past Medical History    has a past medical history of Atrial fibrillation ([place]), COVID-19, Hernia of abdominal cavity, and Renal disorder.   He is a 30-pack-year smoker.  Quit in 1972.  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  No mold, hot tub, Jacuzzi Denies any arthritis, morning stiffness, Raynaud's phenomenon or rash.  Significant Hospital Events   9/29-admit  Consults:  PCCM  Procedures:    Significant Diagnostic Tests:  CTA 04/04/2020-no PE, multifocal airspace disease, small pleural effusion, traction bronchiectasis in the lower lobes.  I have reviewed the images personally.  Micro Data:  Blood culture 9/27, 9/28 >   Antimicrobials:    Interim history/subjective:   Feels well with no issues. On 4 L oxygen now.  Objective   Blood pressure 112/66, pulse 78, temperature 98.2 F (36.8 C), temperature source Oral, resp. rate 18, height 5\' 10"  (1.778 m), weight 61.7 kg, SpO2 99 %.        Intake/Output Summary (Last 24 hours) at 04/07/2020 1709 Last data filed at 04/07/2020 1310 Gross per 24 hour  Intake 360 ml  Output 1175 ml   Net -815 ml   Filed Weights   04/04/20 1056 04/05/20 1818 04/06/20 2107  Weight: 61.2 kg 59.5 kg 61.7 kg    Examination: Gen:      No acute distress HEENT:  EOMI, sclera anicteric Neck:     No masses; no thyromegaly Lungs:    Scattered crackles CV:         Regular rate and rhythm; no murmurs Abd:      + bowel sounds; soft, non-tender; no palpable masses, no distension Ext:    No edema; adequate peripheral perfusion Skin:      Warm and dry; no rash Neuro: alert and oriented x 3 Psych: normal mood and affect  Resolved Hospital Problem list     Assessment & Plan:  Acute hypoxic respiratory failure Recent COVID-19 pneumonia CT scan reviewed with persistent bilateral pulmonary infiltrates Likely has post Covid pneumonitis, organizing pneumonia. No evidence of exposures, CTD ILD.  Low suspicion for bacterial pneumonia  Continue Solu-Medrol 40 mg twice daily. Can change to prednisone 60 mg tomorrow and slow taper by 10 mg every 2 weeks.  Then hold dose at 40 mg till he can be seen in office. We will make a follow up. He may need to be discharged on supplemental O2.   Discussed with patient and wife.  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 Please call with questions.   Best practice:  Per primary  Critical care time: NA   Marshell Garfinkel MD [facility] Please see [email or web address] for pager details.  04/07/2020, 5:09 PM

## 2020-04-07 NOTE — Progress Notes (Addendum)
   Subjective: The patient is doing okay this AM. He had no overnight events. Patient has not been able to get out of bed to test stamina in the past 24h.  Objective:  Vital signs in last 24 hours: Vitals:   04/06/20 1645 04/06/20 2107 04/07/20 0511 04/07/20 0940  BP: 118/77 113/73 (!) 107/50 108/65  Pulse: 84 87 73 88  Resp: 18 17 17 18   Temp: 99.4 F (37.4 C) 98.7 F (37.1 C) 97.7 F (36.5 C) (!) 97.4 F (36.3 C)  TempSrc: Oral Oral Oral Oral  SpO2: 97% 98% 96% 99%  Weight:  61.7 kg    Height:       Physical Exam Constitutional:      General: He is not in acute distress. Cardiovascular:     Rate and Rhythm: Normal rate and regular rhythm.  Pulmonary:     Effort: Pulmonary effort is normal.     Comments: Faint bibasilar crackles Breathing more comfortably on 4L [place] today. Appears to have less work of breathing while talking today in comparison to yesterday Abdominal:     General: Bowel sounds are normal.     Palpations: Abdomen is soft.     Tenderness: There is no abdominal tenderness.  Neurological:     Mental Status: He is alert.  Psychiatric:        Mood and Affect: Mood normal.     Assessment/Plan:  Principal Problem:   ILD (interstitial lung disease) ([place]) Active Problems:   Chronic kidney disease, stage III (moderate) (HCC)   Single subsegmental pulmonary embolism without acute cor pulmonale (HCC)   History of COVID-19  COVID19-associated Interstitial Lung Disease: Requiring 4L  today, still very function limited by dyspnea. CRP and ESR were elevated 19.6 and 99, respectively. Greatly appreciate Dr. Matilde Bash consultation, planning for prolonged steroid taper.  - Solu-Medrol 40 BID  - Covid vaccine Dose 1, will lower risk of future breakthrough infection - Wean supplemental oxygen as able to maintain saturations above 92%   Pulmonary embolism: Diagnosed on August 14, this was provoked due to COVID-19.  Tolerating anticoagulation well with Lovenox 60 mg  twice daily.  Reviewed CT angiogram this admission showed resolution of the PE.  - Lovenox 60mg  q12 hours - Probably a good candidate for DOAC at discharge, would be easier to administer   A fib Patient home medication is verapamil.  - Continue verapamil 120mg  - Continuous tele - Lovenox 60mg  q 12   Essential tremor Patient has hx of essential tremor and takes primidone. - Primidone 150qhs daily at breakfast   Anxiety Restart patient home Zoloft 50mg . Patient also was prescribed Remeron 7.5mg  qhs for anxiety and poor sleep during initial hospitalization for Covid. He has continued to take remeron at home - Continue home medications.   GERD Patient has a hx of GERD and takes 20mg  famotidine daily.  - Continue home med Prior to Admission Living Arrangement: Home Anticipated Discharge Location: Home? Barriers to Discharge: Treatment Dispo: Anticipated discharge in approximately 1-2 day(s).   Freida Busman, MD 04/07/2020, 2:23 PM Pager: [phone] After 5pm on weekdays and 1pm on weekends: On Call pager [phone]

## 2020-04-07 NOTE — Progress Notes (Signed)
ANTICOAGULATION CONSULT NOTE - Initial Consult  Pharmacy Consult for lovenox  Indication: atrial fibrillation.Recent PE  Allergies  Allergen Reactions  . Nsaids     Other reaction(s): Unknown  . Acetaminophen-Codeine Other (See Comments)    Chest Pian/ pressure Chest Pian/ pressure   . Ibuprofen Other (See Comments)    Other reaction(s): HICCUPS UNKNOWN   . Ipratropium Bromide Other (See Comments)    Headache, nausea Headache, nausea   . Meloxicam Other (See Comments)    Chest Pain Chest Pain   . Metronidazole Nausea And Vomiting    Other reaction(s): NAUSEA,VOMITING   . Other Other (See Comments)    Other reaction(s): RENAL INSUFFICIENCY RENAL INSUFFICIENCY   . Sulindac Other (See Comments)    Other reaction(s): RECTAL BLEEDING     Patient Measurements: Height: 5\' 10"  (177.8 cm) Weight: 61.7 kg (136 lb 0.4 oz) IBW/kg (Calculated) : 73  Vital Signs: Temp: 97.7 F (36.5 C) (09/30 0511) Temp Source: Oral (09/30 0511) BP: 107/50 (09/30 0511) Pulse Rate: 73 (09/30 0511)  Labs: Recent Labs    04/04/20 1115 04/04/20 1115 04/04/20 1150 04/04/20 1242 04/04/20 1750 04/05/20 0421 04/06/20 0747  HGB 12.0*   < >  --   --   --  11.7* 11.4*  HCT 36.3*  --   --   --   --  35.9* 35.5*  PLT 216  --   --   --   --  217 273  LABPROT  --   --  13.1  --   --   --   --   INR  --   --  1.0  --   --   --   --   CREATININE 0.97  --   --   --   --  0.98  --   TROPONINIHS 19*  --   --  20* 19*  --   --    < > = values in this interval not displayed.    Estimated Creatinine Clearance: 51.6 mL/min (by C-G formula based on SCr of 0.98 mg/dL).   Medical History: Past Medical History:  Diagnosis Date  . Atrial fibrillation ([place])   . COVID-19   . Hernia of abdominal cavity   . Renal disorder     Medications:  Scheduled:  . enoxaparin (LOVENOX) injection  1 mg/kg Subcutaneous Q12H  . famotidine  20 mg Oral Daily  . melatonin  3 mg Oral QHS  . methylPREDNISolone  (SOLU-MEDROL) injection  40 mg Intravenous Q12H  . mirtazapine  7.5 mg Oral QHS  . primidone  150 mg Oral QHS  . sertraline  50 mg Oral Daily  . verapamil  120 mg Oral Daily    Assessment: 79yoM on lovenox 40mg  BID PTA for history of PE. He tried apixaban prior to lovenox but developed hemoptysis and apixaban was d/cd. He also has a history of atrial fibrillation. Hemoglobin 11.4, PLT WNL 273.  Pharmacy consulted for full-dose anticoagulation with lovenox.  Goal of Therapy:  Monitor platelets by anticoagulation protocol: Yes   Plan:  Continue Lovenox 60mg  SQ every 12 hours Would discharge on current dose rather than 40mg  Monitor daily CBC, signs/symptoms of bleeding  Kandise Riehle A. Levada Dy, PharmD, BCPS, FNKF Clinical Pharmacist  Please utilize Amion for appropriate phone number to reach the unit pharmacist ([place])

## 2020-04-08 DIAGNOSIS — G250 Essential tremor: Secondary | ICD-10-CM | POA: Diagnosis not present

## 2020-04-08 DIAGNOSIS — I2693 Single subsegmental pulmonary embolism without acute cor pulmonale: Secondary | ICD-10-CM | POA: Diagnosis not present

## 2020-04-08 DIAGNOSIS — I4891 Unspecified atrial fibrillation: Secondary | ICD-10-CM | POA: Diagnosis not present

## 2020-04-08 DIAGNOSIS — J849 Interstitial pulmonary disease, unspecified: Secondary | ICD-10-CM | POA: Diagnosis not present

## 2020-04-08 LAB — CBC WITH DIFFERENTIAL/PLATELET
Abs Immature Granulocytes: 0.1 10*3/uL — ABNORMAL HIGH (ref 0.00–0.07)
Basophils Absolute: 0 10*3/uL (ref 0.0–0.1)
Basophils Relative: 0 %
Eosinophils Absolute: 0 10*3/uL (ref 0.0–0.5)
Eosinophils Relative: 0 %
HCT: 36.6 % — ABNORMAL LOW (ref 39.0–52.0)
Hemoglobin: 11.8 g/dL — ABNORMAL LOW (ref 13.0–17.0)
Immature Granulocytes: 1 %
Lymphocytes Relative: 6 %
Lymphs Abs: 0.5 10*3/uL — ABNORMAL LOW (ref 0.7–4.0)
MCH: 34.3 pg — ABNORMAL HIGH (ref 26.0–34.0)
MCHC: 32.2 g/dL (ref 30.0–36.0)
MCV: 106.4 fL — ABNORMAL HIGH (ref 80.0–100.0)
Monocytes Absolute: 0.1 10*3/uL (ref 0.1–1.0)
Monocytes Relative: 1 %
Neutro Abs: 7.4 10*3/uL (ref 1.7–7.7)
Neutrophils Relative %: 92 %
Platelets: 395 10*3/uL (ref 150–400)
RBC: 3.44 MIL/uL — ABNORMAL LOW (ref 4.22–5.81)
RDW: 16.9 % — ABNORMAL HIGH (ref 11.5–15.5)
WBC: 8.1 10*3/uL (ref 4.0–10.5)
nRBC: 0 % (ref 0.0–0.2)

## 2020-04-08 LAB — GLUCOSE, CAPILLARY: Glucose-Capillary: 128 mg/dL — ABNORMAL HIGH (ref 70–99)

## 2020-04-08 MED ORDER — PREDNISONE 50 MG PO TABS
60.0000 mg | ORAL_TABLET | Freq: Every day | ORAL | Status: DC
  Administered 2020-04-08 – 2020-04-09 (×2): 60 mg via ORAL
  Filled 2020-04-08 (×2): qty 1

## 2020-04-08 MED ORDER — PREDNISONE 20 MG PO TABS: ORAL_TABLET | ORAL | 0 refills | Status: DC

## 2020-04-08 MED ORDER — MIRTAZAPINE 7.5 MG PO TABS: 7.5000 mg | ORAL_TABLET | Freq: Every day | ORAL | 0 refills | Status: AC

## 2020-04-08 MED ORDER — WARFARIN SODIUM 2.5 MG PO TABS
2.5000 mg | ORAL_TABLET | Freq: Once | ORAL | Status: AC
  Administered 2020-04-08: 2.5 mg via ORAL
  Filled 2020-04-08: qty 1

## 2020-04-08 MED ORDER — WARFARIN SODIUM 2.5 MG PO TABS: 2.5000 mg | ORAL_TABLET | Freq: Every day | ORAL | 0 refills | Status: DC

## 2020-04-08 MED ORDER — WARFARIN - PHARMACIST DOSING INPATIENT: Freq: Every day | Status: DC

## 2020-04-08 MED ORDER — EDOXABAN TOSYLATE 30 MG PO TABS: 30.0000 mg | ORAL_TABLET | Freq: Every day | ORAL | 0 refills | Status: DC

## 2020-04-08 MED ORDER — ENOXAPARIN SODIUM 60 MG/0.6ML ~~LOC~~ SOLN: 1.0000 mg/kg | Freq: Two times a day (BID) | SUBCUTANEOUS | 0 refills | Status: DC

## 2020-04-08 MED FILL — WARFARIN SODIUM 2.5 MG TAB: 2.5 | 30 days supply | Qty: 30 | Fill #0

## 2020-04-08 MED FILL — ENOXAPARIN SODIUM 60 MG/0.6: 60 | 30 days supply | Qty: 14 | Fill #0

## 2020-04-08 NOTE — Progress Notes (Signed)
SATURATION QUALIFICATIONS: (This note is used to comply with regulatory documentation for home oxygen)  Patient Saturations on Room Air at Rest = 87%  Patient Saturations on Room Air while Ambulating = 86%  Patient Saturations on 4 Liters of oxygen while Ambulating = 90%  Please briefly explain why patient needs home oxygen: Pt requires supplemental 02 to maintain oxygen saturations >90% with activity and walking.     Zettie Cooley, DPT Acute Rehabilitation Services Pager [phone] Office [phone]

## 2020-04-08 NOTE — Progress Notes (Signed)
ANTICOAGULATION CONSULT NOTE - Initial Consult  Pharmacy Consult for lovenox/coumadin Indication: atrial fibrillation.Recent PE  Allergies  Allergen Reactions  . Nsaids     Other reaction(s): Unknown  . Acetaminophen-Codeine Other (See Comments)    Chest Pian/ pressure Chest Pian/ pressure   . Ibuprofen Other (See Comments)    Other reaction(s): HICCUPS UNKNOWN   . Ipratropium Bromide Other (See Comments)    Headache, nausea Headache, nausea   . Meloxicam Other (See Comments)    Chest Pain Chest Pain   . Metronidazole Nausea And Vomiting    Other reaction(s): NAUSEA,VOMITING   . Other Other (See Comments)    Other reaction(s): RENAL INSUFFICIENCY RENAL INSUFFICIENCY   . Sulindac Other (See Comments)    Other reaction(s): RECTAL BLEEDING     Patient Measurements: Height: 5\' 10"  (177.8 cm) Weight: 61.6 kg (135 lb 12.9 oz) IBW/kg (Calculated) : 73  Vital Signs: Temp: 97.5 F (36.4 C) (10/01 0917) Temp Source: Oral (10/01 0917) BP: 103/67 (10/01 0917) Pulse Rate: 90 (10/01 0917)  Labs: Recent Labs    04/06/20 0747 04/06/20 0747 04/07/20 0901 04/08/20 0813  HGB 11.4*   < > 11.5* 11.8*  HCT 35.5*  --  36.0* 36.6*  PLT 273  --  349 395   < > = values in this interval not displayed.    Estimated Creatinine Clearance: 51.5 mL/min (by C-G formula based on SCr of 0.98 mg/dL).   Medical History: Past Medical History:  Diagnosis Date  . Atrial fibrillation ([place])   . COVID-19   . Hernia of abdominal cavity   . Renal disorder     Medications:  Scheduled:  . enoxaparin (LOVENOX) injection  1 mg/kg Subcutaneous Q12H  . famotidine  20 mg Oral Daily  . melatonin  3 mg Oral QHS  . mirtazapine  7.5 mg Oral QHS  . predniSONE  60 mg Oral Q breakfast  . primidone  150 mg Oral QHS  . sertraline  50 mg Oral Daily  . verapamil  120 mg Oral Daily    Assessment: 79yoM on lovenox 40mg  BID PTA for history of PE. He tried apixaban prior to lovenox but developed  hemoptysis and apixaban was d/cd. He also has a history of atrial fibrillation. Hemoglobin has been stable around 11, plt wnl. Plan to dc home with coumadin and bridge with lovenox. Since he is on the older side we will start on lower dose. Baseline INR 1. Avoiding NOAC due to interaction or cost.    Goal of Therapy:  INR 2-3 Monitor platelets by anticoagulation protocol: Yes   Plan:  Continue Lovenox 60mg  SQ every 12 hours Coumadin 2.5mg  PO x1 (daily if dc home today) Daily INR or check Monday if dced  Onnie Boer, PharmD, BCIDP, AAHIVP, CPP Infectious Disease Pharmacist 04/08/2020 4:45 PM

## 2020-04-08 NOTE — Progress Notes (Signed)
   Subjective: Patient reports doing well this AM. Patient reported that he did not get Covid vaccine yesterday. He was reluctant do to his wife's concern, but he continued to read about the vaccine overnight. He does feel that his breathing has improved a little from presentation, but he continues to have significant DOE.   Objective:  Vital signs in last 24 hours: Vitals:   04/07/20 1630 04/07/20 2048 04/08/20 0420 04/08/20 0917  BP: 112/66 108/61 137/82 103/67  Pulse: 78 70 61 90  Resp: 18 16  (!) 25  Temp: 98.2 F (36.8 C) 98.7 F (37.1 C) 97.8 F (36.6 C) (!) 97.5 F (36.4 C)  TempSrc: Oral Oral Oral Oral  SpO2: 99% 99% 99% 99%  Weight:  61.6 kg    Height:       Physical Exam HENT:     Head: Normocephalic and atraumatic.  Cardiovascular:     Rate and Rhythm: Regular rhythm. Tachycardia present.  Pulmonary:     Breath sounds: Normal breath sounds. No wheezing, rhonchi or rales.     Comments: Patient able to talk without sounding as breathless this AM. Abdominal:     General: Bowel sounds are normal.     Palpations: Abdomen is soft. There is no mass.  Neurological:     Mental Status: He is alert.     Assessment/Plan:  Principal Problem:   ILD (interstitial lung disease) ([place]) Active Problems:   Chronic kidney disease, stage III (moderate) (HCC)   Single subsegmental pulmonary embolism without acute cor pulmonale (HCC)   History of COVID-19  COVID19-associated Interstitial Lung Disease: Requiring 3L [place] today, still very function limited by dyspnea. Patient ambulated with pulse ox during PT. Patient required 4L [place] to sat 90% while walking. PT does not recommend any follow-up with patient outpatient. Patient will continue considering the Covid vaccine. - Transition patient to 60mg  prednisone today, with a decrease 10mg  every 2 weeks. Patient will be held at 40mg  until he can see outpatient Pulmonology.  - 4L [place] with ambulation  Pulmonary embolism: Diagnosed on  August 14, this was provoked due to COVID-19. Tolerating anticoagulation well with Lovenox 60 mg twice daily. Reviewed CT angiogram this admission showed resolution of the PE.  Spoke with patient about DOAC and he was okay with taking medication PO vs injection. However upon closer inspection most DOAC's interact with patient's medications. Only DOAC acceptable is Savaysa, which is not covered by his insurance. Discussed with patient the cost of his lovenox and if it too expensive warfarin may be his best option. Patient will consider his options.  - Lovenox 60mg  q12 hours  A fib Patient home medication is verapamil.  - Continue verapamil120mg  - Continuous tele -Lovenox 60mg  q 12  Essential tremor Patient has hx of essential tremor and takes primidone. - Primidone 150qhs daily at breakfast  Anxiety Restart patient home Zoloft 50mg . Patient also was prescribed Remeron 7.5mg  qhs for anxiety and poor sleep during initial hospitalization for Covid. He has continued to take remeron at home - Continue home medications.  GERD Patient has a hx of GERD and takes 20mg  famotidine daily.  - Continue home med  Prior to Admission Living Arrangement: Home Anticipated Discharge Location: Home Barriers to Discharge: Treatment Dispo: Anticipated discharge in approximately 1-2 day(s).   Freida Busman, MD 04/08/2020, 1:16 PM Pager: [phone] After 5pm on weekdays and 1pm on weekends: On Call pager [phone]

## 2020-04-08 NOTE — Care Management Important Message (Signed)
Important Message  Patient Details  Name: JISHNU JENNIGES MRN: [id number] Date of Birth: 1940-11-22   Medicare Important Message Given:  Yes - Important Message mailed due to current National Emergency  Verbal consent obtained due to current National Emergency  Relationship to patient: Self Contact Name: Kipper Buch Call Date: 04/08/20  Time: 1130 Phone: [phone] Outcome: Spoke with contact Important Message mailed to: Patient address on file    Delorse Lek 04/08/2020, 11:31 AM

## 2020-04-08 NOTE — Evaluation (Signed)
Physical Therapy Evaluation Patient Details Name: George Sandoval MRN: [id number] DOB: 09/10/1940 Today's Date: 04/08/2020   History of Present Illness  Patient is a 79 y/o male who presents with dyspnea and hypoxia. Admitted with acute respiratory failure and recent Covid PNA. CTA-multifocal airspace disease, small pleural effusion, traction bronchiectasis in the lower lobes. PMH includes A-fib, prostate cancer, CKD, polycystic kidney disease, HTN, Covid infection 02/08/2020 with hospital admission.  Clinical Impression  Patient presents with dyspnea on exertion, decreased activity tolerance, decreased endurance and impaired mobility s/p above. Pt lives at home with wife and reports using RW for ambulation recently due to breathing difficulties. Pt doing own ADLs. Today, pt tolerated transfers and gait training with Min guard and use of RW for safety. Sp02 dropped to 86% on RA, donned 4L/min 02 and able to maintain Sp02 >90% with activity with 2/4 DOE. Education re: importance of short bouts of activity with longer rest breaks, pursed lip breathing, use of IS etc. Encouraged increasing activity and walking with nursing a few times daily to work on weaning 02. Would benefit from outpatient cardiopulmonary rehab. Will follow acutely to maximize independence and mobility prior to return home.    Follow Up Recommendations Other (comment);Supervision - Intermittent (outpatient pulmonary rehab)    Equipment Recommendations  None recommended by PT    Recommendations for Other Services       Precautions / Restrictions Precautions Precautions: Other (comment) Precaution Comments: watch 02 Restrictions Weight Bearing Restrictions: No      Mobility  Bed Mobility Overal bed mobility: Needs Assistance Bed Mobility: Supine to Sit     Supine to sit: Supervision;HOB elevated     General bed mobility comments: Use of rails and increased time to get to EOB.  Transfers Overall transfer level:  Needs assistance Equipment used: Rolling walker (2 wheeled) Transfers: Sit to/from Stand Sit to Stand: Min guard         General transfer comment: Min guard for safety. Cues for hand placement as pt with difficulty standing initially pulling up on RW. Transferred to chair post ambulation.  Ambulation/Gait Ambulation/Gait assistance: Min guard Gait Distance (Feet): 70 Feet Assistive device: Rolling walker (2 wheeled) Gait Pattern/deviations: Step-through pattern;Decreased stride length Gait velocity: decreased Gait velocity interpretation: <1.31 ft/sec, indicative of household ambulator General Gait Details: Slow, mostly steady galt with use of RW; Sp02 dropped to 86% on 2L/min 02 [place], increased to 4L and able to maintain Sp02 >90%. 2/4 DOE.  Stairs            Wheelchair Mobility    Modified Rankin (Stroke Patients Only)       Balance Overall balance assessment: Needs assistance Sitting-balance support: Feet supported;No upper extremity supported Sitting balance-Leahy Scale: Good     Standing balance support: During functional activity Standing balance-Leahy Scale: Fair Standing balance comment: Able to stand without UE support; does better with UE for ambulation/energy conservation.                             Pertinent Vitals/Pain Pain Assessment: No/denies pain    Home Living Family/patient expects to be discharged to:: Private residence Living Arrangements: Spouse/significant other Available Help at Discharge: Family;Available 24 hours/day Type of Home: House Home Access: Stairs to enter Entrance Stairs-Rails: Right Entrance Stairs-Number of Steps: 2 Home Layout: One level Home Equipment: Walker - 2 wheels;Cane - single point;Shower seat      Prior Function Level of Independence: Independent with assistive  device(s)         Comments: Was using RW for ambulation, doing own ADLs. Wife does IADls.     Hand Dominance   Dominant Hand:  Right    Extremity/Trunk Assessment   Upper Extremity Assessment Upper Extremity Assessment: Defer to OT evaluation    Lower Extremity Assessment Lower Extremity Assessment: Overall WFL for tasks assessed    Cervical / Trunk Assessment Cervical / Trunk Assessment: Kyphotic  Communication   Communication: HOH  Cognition Arousal/Alertness: Awake/alert Behavior During Therapy: WFL for tasks assessed/performed Overall Cognitive Status: Within Functional Limits for tasks assessed                                        General Comments General comments (skin integrity, edema, etc.): Sp02 dropped to 87% on RA, required 4L/min 02 [place] to maintain Sp02 >90% during activity.    Exercises     Assessment/Plan    PT Assessment Patient needs continued PT services  PT Problem List Decreased mobility;Cardiopulmonary status limiting activity;Decreased activity tolerance       PT Treatment Interventions Therapeutic exercise;Patient/family education;Stair training;Functional mobility training;Therapeutic activities    PT Goals (Current goals can be found in the Care Plan section)  Acute Rehab PT Goals Patient Stated Goal: to go home and be off oxygen PT Goal Formulation: With patient Time For Goal Achievement: 04/22/20 Potential to Achieve Goals: Good    Frequency Min 3X/week   Barriers to discharge        Co-evaluation               AM-PAC PT "6 Clicks" Mobility  Outcome Measure Help needed turning from your back to your side while in a flat bed without using bedrails?: None Help needed moving from lying on your back to sitting on the side of a flat bed without using bedrails?: A Little Help needed moving to and from a bed to a chair (including a wheelchair)?: A Little Help needed standing up from a chair using your arms (e.g., wheelchair or bedside chair)?: A Little Help needed to walk in hospital room?: A Little Help needed climbing 3-5 steps with a  railing? : A Little 6 Click Score: 19    End of Session Equipment Utilized During Treatment: Oxygen;Gait belt Activity Tolerance: Treatment limited secondary to medical complications (Comment) (drop in Sp02) Patient left: in chair;with call bell/phone within reach Nurse Communication: Mobility status;Other (comment) (02) PT Visit Diagnosis: Other (comment) (DOE)    Time: 7371-0626 PT Time Calculation (min) (ACUTE ONLY): 34 min   Charges:   PT Evaluation $PT Eval Moderate Complexity: 1 Mod PT Treatments $Therapeutic Activity: 8-22 mins        Marisa Severin, PT, DPT Acute Rehabilitation Services Pager [phone] Office [place] 04/08/2020, 10:33 AM

## 2020-04-08 NOTE — TOC Transition Note (Signed)
Transition of Care [facility]) - CM/SW Discharge Note   Patient Details  Name: George Sandoval MRN: [id number] Date of Birth: 02/15/1941  Transition of Care [facility]) CM/SW Contact:  Angelita Ingles, RN Phone Number: [phone]  04/08/2020, 1:49 PM   Clinical Narrative:    Cm consulted for Home O2 set up. Home O2 has been set up through [facility]. Portable tank to be delivered to the room and set up for home delivery will be discussed with patient and family. No further needs noted at this time. TOC to sign off.    Final next level of care: Home/Self Care Barriers to Discharge: No Barriers Identified   Patient Goals and CMS Choice        Discharge Placement                       Discharge Plan and Services                DME Arranged: Oxygen DME Agency: Other - Comment ([place]) Date DME Agency Contacted: 04/08/20 Time DME Agency Contacted: [phone] Representative spoke with at DME Agency: [place] ([place]) Interventions     Readmission Risk Interventions No flowsheet data found.

## 2020-04-09 DIAGNOSIS — I2693 Single subsegmental pulmonary embolism without acute cor pulmonale: Secondary | ICD-10-CM | POA: Diagnosis not present

## 2020-04-09 DIAGNOSIS — I4891 Unspecified atrial fibrillation: Secondary | ICD-10-CM | POA: Diagnosis not present

## 2020-04-09 DIAGNOSIS — G250 Essential tremor: Secondary | ICD-10-CM | POA: Diagnosis not present

## 2020-04-09 DIAGNOSIS — J849 Interstitial pulmonary disease, unspecified: Secondary | ICD-10-CM | POA: Diagnosis not present

## 2020-04-09 LAB — CBC WITH DIFFERENTIAL/PLATELET
Abs Immature Granulocytes: 0.12 10*3/uL — ABNORMAL HIGH (ref 0.00–0.07)
Basophils Absolute: 0 10*3/uL (ref 0.0–0.1)
Basophils Relative: 0 %
Eosinophils Absolute: 0 10*3/uL (ref 0.0–0.5)
Eosinophils Relative: 0 %
HCT: 33.5 % — ABNORMAL LOW (ref 39.0–52.0)
Hemoglobin: 10.9 g/dL — ABNORMAL LOW (ref 13.0–17.0)
Immature Granulocytes: 2 %
Lymphocytes Relative: 12 %
Lymphs Abs: 0.8 10*3/uL (ref 0.7–4.0)
MCH: 34.3 pg — ABNORMAL HIGH (ref 26.0–34.0)
MCHC: 32.5 g/dL (ref 30.0–36.0)
MCV: 105.3 fL — ABNORMAL HIGH (ref 80.0–100.0)
Monocytes Absolute: 0.5 10*3/uL (ref 0.1–1.0)
Monocytes Relative: 8 %
Neutro Abs: 5 10*3/uL (ref 1.7–7.7)
Neutrophils Relative %: 78 %
Platelets: 376 10*3/uL (ref 150–400)
RBC: 3.18 MIL/uL — ABNORMAL LOW (ref 4.22–5.81)
RDW: 16.5 % — ABNORMAL HIGH (ref 11.5–15.5)
WBC: 6.4 10*3/uL (ref 4.0–10.5)
nRBC: 0 % (ref 0.0–0.2)

## 2020-04-09 LAB — CULTURE, BLOOD (ROUTINE X 2)
Culture: NO GROWTH
Special Requests: ADEQUATE

## 2020-04-09 LAB — PROTIME-INR
INR: 1.4 — ABNORMAL HIGH (ref 0.8–1.2)
Prothrombin Time: 16.6 seconds — ABNORMAL HIGH (ref 11.4–15.2)

## 2020-04-09 LAB — GLUCOSE, CAPILLARY: Glucose-Capillary: 82 mg/dL (ref 70–99)

## 2020-04-09 MED ORDER — PREDNISONE 20 MG PO TABS: ORAL_TABLET | ORAL | 0 refills | Status: AC

## 2020-04-09 MED ORDER — WARFARIN SODIUM 2.5 MG PO TABS: 2.5000 mg | ORAL_TABLET | Freq: Once | ORAL | Status: DC

## 2020-04-09 MED ORDER — PREDNISONE 20 MG PO TABS: ORAL_TABLET | ORAL | 0 refills | Status: DC

## 2020-04-09 MED ORDER — ENOXAPARIN SODIUM 40 MG/0.4ML ~~LOC~~ SOLN: 40.0000 mg | SUBCUTANEOUS | 0 refills | Status: DC

## 2020-04-09 NOTE — Progress Notes (Addendum)
[place] for lovenox/coumadin Indication: atrial fibrillation.Recent PE  Patient Measurements: Height: 5\' 10"  (177.8 cm) Weight: 62.9 kg (138 lb 10.7 oz) IBW/kg (Calculated) : 73  Vital Signs: Temp: 98.5 F (36.9 C) (10/02 0433) Temp Source: Oral (10/02 0433) BP: 145/74 (10/02 0433) Pulse Rate: 51 (10/02 0433)  Labs: Recent Labs    04/07/20 0901 04/07/20 0901 04/08/20 0813 04/09/20 0527  HGB 11.5*   < > 11.8* 10.9*  HCT 36.0*  --  36.6* 33.5*  PLT 349  --  395 376  LABPROT  --   --   --  16.6*  INR  --   --   --  1.4*   < > = values in this interval not displayed.    Medical History: Past Medical History:  Diagnosis Date  . Atrial fibrillation ([place])   . COVID-19   . Hernia of abdominal cavity   . Renal disorder     Medications:  Scheduled:  . enoxaparin (LOVENOX) injection  1 mg/kg Subcutaneous Q12H  . famotidine  20 mg Oral Daily  . melatonin  3 mg Oral QHS  . mirtazapine  7.5 mg Oral QHS  . predniSONE  60 mg Oral Q breakfast  . primidone  150 mg Oral QHS  . sertraline  50 mg Oral Daily  . verapamil  120 mg Oral Daily  . Warfarin - Pharmacist Dosing Inpatient   Does not apply q1600    Assessment: 19 yoM on lovenox 40mg  BID PTA for history of PE. He tried apixaban prior to lovenox but developed hemoptysis and apixaban was d/cd. He also has a history of atrial fibrillation. Hemoglobin has been stable around 11, plt wnl. Plan to dc home with coumadin and bridge with lovenox. Current INR 1.4. Avoiding NOAC due to interaction and cost.   DOACs have a major interaction with primidone; warfarin has a lesser interaction with primidone. The resulting effect may lead to reduced anticoagulation effectiveness; however, warfarin is still the better option in this situation.   Pt had recent subsegmental PE in August 2021; though CTA from September 2021 did not reveal a PE.   Goal of Therapy:  INR 2-3 Monitor platelets by  anticoagulation protocol: Yes   Plan:  Continue Lovenox 60mg  SQ every 12 hours Coumadin 2.5mg  PO x1  Daily INR  Due to recent Discontinue Lovenox when INR is > 2 for at least 24 hours   Harvel Quale 04/09/2020 8:18 AM

## 2020-04-09 NOTE — Progress Notes (Addendum)
Subjective: Patient doing well this AM. He reports that his wife found someone who could help get his O2 out the car when they get home if necessary.  Objective:  Vital signs in last 24 hours: Vitals:   04/08/20 1805 04/08/20 2045 04/09/20 0433 04/09/20 0912  BP: 111/60 114/66 (!) 145/74 121/71  Pulse: 82 73 (!) 51 68  Resp: 18   18  Temp: 98.5 F (36.9 C) 98.2 F (36.8 C) 98.5 F (36.9 C) 98.5 F (36.9 C)  TempSrc: Oral Oral Oral Oral  SpO2: 96% 97% 98% 99%  Weight:  62.9 kg    Height:       Physical Exam Cardiovascular:     Rate and Rhythm: Normal rate and regular rhythm.  Pulmonary:     Effort: Pulmonary effort is normal.     Breath sounds: Normal breath sounds.  Abdominal:     General: Bowel sounds are normal.     Palpations: Abdomen is soft.  Neurological:     Mental Status: He is alert.     Assessment/Plan:  Principal Problem:   ILD (interstitial lung disease) ([place]) Active Problems:   Chronic kidney disease, stage III (moderate) (HCC)   Single subsegmental pulmonary embolism without acute cor pulmonale ([place])   History of COVID-19  Mr. Loppnow is an 79 yo male who presented with significant hypoxia after a course of Covid that involved 2 hospitalizations and a PE.  COVID19-associated Interstitial Lung Disease: Requiring 2L [place] today, still very function limited by dyspnea. Patient is able to speak in full sentences without sounding breathless.  - Prednisone 60mg  daily with prolonged taper - Home supplemental oxygen   Provoked Pulmonary embolism:  Patient diagnosed with a small subsegmental pulmonary embolism on August 14 related to COVID-19 pneumonia.  Since that time, he was treated with only prophylactic dosing Lovenox.  Repeat CT scan this admission showed resolution of the pulmonary embolisms.  We considered switching to full dose anticoagulation to complete 3 months of therapy, however DOAC was not an option due to interactions with primidone.  We  considered bridging Lovenox to Coumadin, but due to the patient's age-related mild cognitive impairment and limited support at home, it was apparent that there was high risk for medication error.  On balance, we decided that the risks of anticoagulation out-weighed the benefits.  We will plan for him to continue prophylactic dosing Lovenox for 10 more days, which he is accustomed to doing, while he recovers from this current hospitalization.   A fib Patient home medication is verapamil. Unclear history, not anticoagulated by his PCP, only previously on aspirin. No signs of a fib this hospitalization. Will continue with lovenox for now, he should follow up with his PCP in the future to discuss stroke risk.  - Continue verapamil 120mg  - Continuous tele   Essential tremor Patient has hx of essential tremor and takes primidone. - Primidone 150qhs daily at breakfast   Anxiety Restart patient home Zoloft 50mg . Patient also was prescribed Remeron 7.5mg  qhs for anxiety and poor sleep during initial hospitalization for Covid. He has continued to take remeron at home - Continue home medications.   GERD Patient has a hx of GERD and takes 20mg  famotidine daily.  - Continue home med   Prior to Admission Living Arrangement: Home Anticipated Discharge Location: Home Barriers to Discharge: None Dispo: Anticipated discharge in approximately today  Freida Busman, MD 04/09/2020, 12:11 PM Pager: [phone] After 5pm on weekdays and 1pm on weekends: On Call  pager [phone]

## 2020-04-09 NOTE — Progress Notes (Signed)
George Sandoval to be discharged Home per MD order. Discussed prescriptions and follow up appointments with the patient. Prescriptions and medication list explained in detail. Patient and his wife verbalized understanding.  Skin clean, dry and intact without evidence of skin break down, no evidence of skin tears noted. IV catheter discontinued intact. Site without signs and symptoms of complications. Dressing and pressure applied. Pt denies pain at the site currently. No complaints noted.  Patient free of lines, drains, and wounds.   An After Visit Summary (AVS) was printed and given to the patient. Patient escorted via wheelchair, and discharged home via private auto.  Amaryllis Dyke, RN

## 2020-04-09 NOTE — Discharge Instructions (Signed)
Dear George Sandoval,   Thank you so much for allowing [place] to be part of your care!  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   POST-HOSPITAL & CARE INSTRUCTIONS 1. DO NOT TAKE the COUMADIN/WARFARIN, you may return it to pharmacy. 2. Continue your lovenox injections from what you have already been using. You only need to inject .4 ml ( 40 mg total ) once daily.  3. Prednisone (Steroids) Pills, you will taper per the following instructions:     60 mg - take 60mg  until 04/22/2020,      50 mg- take another 2 weeks approx ( 10/15-10/28/2021)     40 mg- take until you see the pulmonologist 4.  Continue to use Oxygen as needed, take your recovery slow  4. Please let PCP/Specialists know of any changes that were made.  5. Please see medications section of this packet for any medication changes.   DOCTOR'S APPOINTMENT & FOLLOW UP CARE INSTRUCTIONS  1. Schedule and appt. With the Pulmonologist. Appt should not be later than week of Nov. 11th     - Office: [facility], you were seen by Dr. Vaughan Browner in the hospital.     - Office Number: [phone] Address: [address] [place], [place] [address]  2. Follow-up with your Primary Care Provider to get the Covid Vaccine OR you can call [phone] to make an appt with [facility].    Take care and be well!  [facility]  [address] [place], [place] [address] [phone]

## 2020-04-09 NOTE — TOC Transition Note (Signed)
Transition of Care [facility]) - CM/SW Discharge Note   Patient Details  Name: CHLOE BAIG MRN: [id number] Date of Birth: [birth year]  Transition of Care [facility]) CM/SW Contact:  Carles Collet, RN Phone Number: 04/09/2020, 10:25 AM   Clinical Narrative:   Spoke with patient, who deferred to wife. Called and spoke w wife. She states that they have malfunctioning oxygen at home through [employer]. Wife states that she wants to use different company. Agreed upon [employer]. She will cancel O2 with [employer]. Referral placed to [employer] who will deliver tanks to room today and home unit to house after DC.     Final next level of care: Home/Self Care Barriers to Discharge: No Barriers Identified   Patient Goals and CMS Choice        Discharge Placement                       Discharge Plan and Services                DME Arranged: Oxygen DME Agency: Other - Comment ([place]) Date DME Agency Contacted: 04/08/20 Time DME Agency Contacted: [phone] Representative spoke with at DME Agency: [place] ([place]) Interventions     Readmission Risk Interventions No flowsheet data found.

## 2020-04-09 NOTE — Progress Notes (Signed)
Dr. Candie Chroman ordered verbally to hold the warfarin today.

## 2020-04-09 NOTE — Discharge Summary (Addendum)
Name: George Sandoval MRN: [id number] DOB: December 15, 1940 79 y.o. PCP: Karleen Hampshire., MD  Date of Admission: 04/04/2020 10:24 AM Date of Discharge:  04/09/2020 Attending Physician: Axel Filler, *  Discharge Diagnosis: Principal Problem:   ILD (interstitial lung disease) ([place]) Active Problems:   Chronic kidney disease, stage III (moderate) ([place])   Single subsegmental pulmonary embolism without acute cor pulmonale ([place])   History of COVID-19   Discharge Medications: Allergies as of 04/09/2020       Reactions   Nsaids    Other reaction(s): Unknown   Acetaminophen-codeine Other (See Comments)   Chest Pian/ pressure Chest Pian/ pressure   Ibuprofen Other (See Comments)   Other reaction(s): HICCUPS UNKNOWN   Ipratropium Bromide Other (See Comments)   Headache, nausea Headache, nausea   Meloxicam Other (See Comments)   Chest Pain Chest Pain   Metronidazole Nausea And Vomiting   Other reaction(s): NAUSEA,VOMITING   Other Other (See Comments)   Other reaction(s): RENAL INSUFFICIENCY RENAL INSUFFICIENCY   Sulindac Other (See Comments)   Other reaction(s): RECTAL BLEEDING        Medication List     TAKE these medications    albuterol 108 (90 Base) MCG/ACT inhaler Commonly known as: VENTOLIN HFA Inhale 2 puffs into the lungs every 4 (four) hours as needed for wheezing or shortness of breath.   enoxaparin 40 MG/0.4ML injection Commonly known as: LOVENOX Inject 0.4 mLs (40 mg total) into the skin daily. What changed: when to take this   famotidine 20 MG tablet Commonly known as: PEPCID Take 20 mg by mouth 2 (two) times daily.   mirtazapine 7.5 MG tablet Commonly known as: REMERON Take 1 tablet (7.5 mg total) by mouth at bedtime. What changed:  medication strength how much to take   MULTIVITAMIN/IRON PO Take 1 tablet by mouth daily.   predniSONE 20 MG tablet Commonly known as: DELTASONE Take 3 tablets (60 mg total) by mouth daily for 14 days, THEN  2.5 tablets (50 mg total) daily for 14 days, THEN 2 tablets (40 mg total) daily for 14 days. Start taking on: April 08, 2020 What changed: See the new instructions.   primidone 50 MG tablet Commonly known as: MYSOLINE Take 150 mg by mouth daily with breakfast.   sertraline 50 MG tablet Commonly known as: ZOLOFT Take 50 mg by mouth every evening.   sodium chloride 0.65 % Soln nasal spray Commonly known as: OCEAN Place 1 spray into both nostrils daily as needed for congestion.   verapamil 120 MG tablet Commonly known as: CALAN Take 120 mg by mouth daily.               Durable Medical Equipment  (From admission, onward)           Start     Ordered   04/08/20 1328  For home use only DME oxygen  Once       Question Answer Comment  Length of Need 12 Months   Mode or (Route) Nasal cannula   Liters per Minute 4   Frequency Continuous (stationary and portable oxygen unit needed)   Oxygen conserving device Yes   Oxygen delivery system Gas      04/08/20 1327            Disposition and follow-up:   George Sandoval was discharged from [facility] in Stable condition.  At the hospital follow up visit please address:  -Pulmonology : Please assess if patient has  improved symptoms and is doing well on his Prednisone taper: 60mg  2 weeks, 50mg  2 weeks, 40 mg until he has Pulm. appt.  -PCP: Please review patient medication regimen as patient may have difficulty due to the patient's age-related mild cognitive impairment noted during hospital and limited support at home        - Please assist the patient in receiving Pfizer Covid vaccine  2.  Labs / imaging needed at time of follow-up:  PFTS and High- Res CT   3.  Pending labs/ test needing follow-up: None  Follow-up Appointments:  Follow-up Information     [facility] Follow up.   Why: Home oxygen provider. For any problems please call Jermaine at [phone] Contact  information: [place] [address] [phone]         [facility] Follow up.   Specialty: Pulmonology Contact information: [address] [facility] [id number] [place] Hospital Course by problem list: Mr. Hunsinger is an 79 yo male who presented with acute onset hypoxia with a PMH A fib, Covid-19, migraine headaches, GERD, prostate cancer, CKD 3, Iron deficiency anemia, HTN, and polycystics kidney disease.   COVID19-associated Interstitial Lung Disease Patient presented with sudden onset worsening hypoxia and dyspnea.  We ruled out pneumonia, pulmonary edema, and pulmonary embolism as causes for the worsening hypoxia.  CT imaging showed worsening interstitial opacities, bronchiectasis, and fibrosis.  He also had elevated inflammatory markers, all consistent with pneumonitis/organizing pneumonia.  We consulted pulmonology and started treatment with Solu-Medrol, then transitioned to Prednisone 60 with a 6 week taper and follow-up.  He had a good response to systemic steroids with improvement in his oxygenation.  He will follow up with Dr. Vaughan Browner in the pulmonology clinic to discuss future therapies and continue titrating the steroids.  Provoked Pulmonary embolism Patient was taking Lovenox 40mg  BID at home due to recent PE, 8/14 provoked by Covid admission. Repeat CTA this admission showed resolution of PE.  We considered full dose anticoagulation to complete a 79-month course.  Unfortunately a oral DOAC was not available due to interactions with primidone.  We considered Lovenox transitioned to Coumadin, but it was clear that due to his mild cognitive impairment and lack of support at home there was significant risk for medication error.  We decided that on balance, would be safest to continue with only prophylactic dosing of Lovenox at discharge, which the patient has at home and is already comfortable with.  A  fib Patient was continued on his home verapamil.  Not previously anticoagulated, will need to follow-up with his PCP to discuss stroke risk.  We do not see any episodes of A. fib during this admission.  Essential tremor Patient was continued on his home Primidone.  Anxiety Restarted patient's Zoloft and Remeron.  GERD Patient took famotidine while inpatient.   Discharge Vitals:   BP 121/71 (BP Location: Left Arm)   Pulse 68   Temp 98.5 F (36.9 C) (Oral)   Resp 18   Ht 5\' 10"  (1.778 m)   Wt 62.9 kg   SpO2 99%   BMI 19.90 kg/m   Pertinent Labs, Studies, and Procedures:  DG CHEST PORT 1 VIEW  Result Date: 04/06/2020 CLINICAL DATA:  Hypoxia. EXAM: PORTABLE CHEST 1 VIEW COMPARISON:  April 04, 2020. FINDINGS: The heart size and mediastinal contours are within normal limits. No pneumothorax or pleural  effusion is noted. Stable bilateral interstitial lung opacities are noted concerning for pneumonia. The visualized skeletal structures are unremarkable. IMPRESSION: Stable bilateral interstitial lung opacities are noted concerning for pneumonia. Electronically Signed   By: Marijo Conception M.D.   On: 04/06/2020 13:03     04/04/2020- CTA Chest 04/04/2020- CXR  Discharge Instructions: Discharge Instructions     Diet - low sodium heart healthy   Complete by: As directed    Increase activity slowly   Complete by: As directed        Signed: Freida Busman, MD 04/09/2020, 2:05 PM   Pager: [phone]

## 2020-04-09 NOTE — Evaluation (Signed)
Occupational Therapy Evaluation Patient Details Name: George Sandoval MRN: [id number] DOB: February 11, 1941 Today's Date: 04/09/2020    History of Present Illness Patient is a 79 y/o male who presents with dyspnea and hypoxia. Admitted with acute respiratory failure and recent Covid PNA. CTA-multifocal airspace disease, small pleural effusion, traction bronchiectasis in the lower lobes. PMH includes A-fib, prostate cancer, CKD, polycystic kidney disease, HTN, Covid infection 02/08/2020 with hospital admission.   Clinical Impression   PTA patient reports independent with ADLs, mobility using RW. Admitted for above and limited by problem list below, including decreased activity tolerance, generalized weakness.  Pt on 2L at rest SpO2 93-94%, requires 4L [place] to maintain SpO2 during in room mobility and ADL tasks.  Completing ADLs and transfers with supervision using RW. Educated on energy conservation techniques and activity progression, pursed lip breathing.  Believe patient will benefit from continued OT services while admitted to optimize independence, safety and tolerance to daily routine but anticipate no further needs required after dc home. Will follow.     Follow Up Recommendations  No OT follow up;Supervision - Intermittent    Equipment Recommendations  None recommended by OT    Recommendations for Other Services       Precautions / Restrictions Precautions Precautions: Other (comment) Precaution Comments: watch 02 Restrictions Weight Bearing Restrictions: No      Mobility Bed Mobility Overal bed mobility: Modified Independent             General bed mobility comments: no assist required  Transfers Overall transfer level: Needs assistance Equipment used: Rolling walker (2 wheeled) Transfers: Sit to/from Stand Sit to Stand: Supervision         General transfer comment: for safety, cueing for hand placement     Balance Overall balance assessment: Needs  assistance Sitting-balance support: No upper extremity supported;Feet supported Sitting balance-Leahy Scale: Good     Standing balance support: During functional activity;Bilateral upper extremity supported;No upper extremity supported Standing balance-Leahy Scale: Fair Standing balance comment: no UE support required dynamically during ADL engagement, UE support for ambulation                           ADL either performed or assessed with clinical judgement   ADL Overall ADL's : Needs assistance/impaired     Grooming: Supervision/safety;Sitting   Upper Body Bathing: Supervision/ safety;Sitting   Lower Body Bathing: Supervison/ safety;Sit to/from stand   Upper Body Dressing : Supervision/safety;Sitting   Lower Body Dressing: Supervision/safety;Sit to/from stand   Toilet Transfer: Supervision/safety;Ambulation;RW Toilet Transfer Details (indicate cue type and reason): simulated in room         Functional mobility during ADLs: Supervision/safety;Rolling walker General ADL Comments: supervision for safety, cueing for PLB and energy conservation techniques      Vision         Perception     Praxis      Pertinent Vitals/Pain Pain Assessment: No/denies pain     Hand Dominance Right   Extremity/Trunk Assessment Upper Extremity Assessment Upper Extremity Assessment: Overall WFL for tasks assessed   Lower Extremity Assessment Lower Extremity Assessment: Defer to PT evaluation   Cervical / Trunk Assessment Cervical / Trunk Assessment: Kyphotic   Communication Communication Communication: HOH   Cognition Arousal/Alertness: Awake/alert Behavior During Therapy: WFL for tasks assessed/performed Overall Cognitive Status: Within Functional Limits for tasks assessed  General Comments  at rest on 2L with Spo2 93-94%, with ADL engagement/in room moibity required 4L/min [place] to maintain >90% Spo2      Exercises     Shoulder Instructions      Home Living Family/patient expects to be discharged to:: Private residence Living Arrangements: Spouse/significant other Available Help at Discharge: Family;Available 24 hours/day Type of Home: House Home Access: Stairs to enter [employer] of Steps: 2 Entrance Stairs-Rails: Right Home Layout: One level     Bathroom Shower/Tub: Occupational psychologist: Handicapped height     Home Equipment: Environmental consultant - 2 wheels;Cane - single point;Shower seat          Prior Functioning/Environment Level of Independence: Independent with assistive device(s)        Comments: Was using RW for ambulation, doing own ADLs. Wife does IADls.        OT Problem List: Decreased strength;Impaired balance (sitting and/or standing);Decreased activity tolerance;Decreased safety awareness;Cardiopulmonary status limiting activity;Decreased knowledge of precautions;Decreased knowledge of use of DME or AE      OT Treatment/Interventions: Self-care/ADL training;Energy conservation;DME and/or AE instruction;Patient/family education;Balance training;Therapeutic activities    OT Goals(Current goals can be found in the care plan section) Acute Rehab OT Goals Patient Stated Goal: to go home and be off oxygen OT Goal Formulation: With patient Time For Goal Achievement: 04/23/20 Potential to Achieve Goals: Good  OT Frequency: Min 2X/week   Barriers to D/C:            Co-evaluation              AM-PAC OT "6 Clicks" Daily Activity     Outcome Measure Help from another person eating meals?: None Help from another person taking care of personal grooming?: A Little Help from another person toileting, which includes using toliet, bedpan, or urinal?: A Little Help from another person bathing (including washing, rinsing, drying)?: A Little Help from another person to put on and taking off regular upper body clothing?: A Little Help from  another person to put on and taking off regular lower body clothing?: A Little 6 Click Score: 19   End of Session Equipment Utilized During Treatment: Rolling walker;Oxygen Nurse Communication: Mobility status  Activity Tolerance: Patient tolerated treatment well Patient left: in chair;with call bell/phone within reach  OT Visit Diagnosis: Muscle weakness (generalized) (M62.81)                Time: 3299-2426 OT Time Calculation (min): 30 min Charges:  OT General Charges $OT Visit: 1 Visit OT Evaluation $OT Eval Moderate Complexity: 1 Mod OT Treatments $Self Care/Home Management : 8-22 mins  Jolaine Artist, OT Acute Rehabilitation Services Pager [phone] Office [phone]   Delight Stare 04/09/2020, 12:18 PM

## 2020-04-09 NOTE — Plan of Care (Signed)
  Problem: Education: Goal: Knowledge of disease or condition will improve Outcome: Completed/Met Goal: Knowledge of the prescribed therapeutic regimen will improve Outcome: Completed/Met   Problem: Activity: Goal: Ability to tolerate increased activity will improve Outcome: Completed/Met Goal: Will verbalize the importance of balancing activity with adequate rest periods Outcome: Completed/Met   Problem: Respiratory: Goal: Ability to maintain adequate ventilation will improve Outcome: Completed/Met   Problem: Education: Goal: Knowledge of General Education information will improve Description: Including pain rating scale, medication(s)/side effects and non-pharmacologic comfort measures Outcome: Completed/Met   Problem: Health Behavior/Discharge Planning: Goal: Ability to manage health-related needs will improve Outcome: Completed/Met   Problem: Clinical Measurements: Goal: Ability to maintain clinical measurements within normal limits will improve Outcome: Completed/Met Goal: Will remain free from infection Outcome: Completed/Met Goal: Diagnostic test results will improve Outcome: Completed/Met Goal: Respiratory complications will improve Outcome: Completed/Met Goal: Cardiovascular complication will be avoided Outcome: Completed/Met   Problem: Activity: Goal: Risk for activity intolerance will decrease Outcome: Completed/Met   Problem: Elimination: Goal: Will not experience complications related to bowel motility Outcome: Completed/Met Goal: Will not experience complications related to urinary retention Outcome: Completed/Met   Problem: Pain Managment: Goal: General experience of comfort will improve Outcome: Completed/Met   Problem: Safety: Goal: Ability to remain free from injury will improve Outcome: Completed/Met   Problem: Skin Integrity: Goal: Risk for impaired skin integrity will decrease Outcome: Completed/Met

## 2020-04-10 LAB — CULTURE, BLOOD (ROUTINE X 2)
Culture: NO GROWTH
Special Requests: ADEQUATE

## 2020-05-03 ENCOUNTER — Encounter: Payer: Self-pay | Admitting: Pulmonary Disease

## 2020-05-03 ENCOUNTER — Ambulatory Visit: Payer: Medicare HMO | Admitting: Pulmonary Disease

## 2020-05-03 ENCOUNTER — Other Ambulatory Visit: Payer: Self-pay

## 2020-05-03 VITALS — BP 128/72 | HR 72 | Temp 98.6°F | Ht 70.0 in | Wt 140.4 lb

## 2020-05-03 DIAGNOSIS — J849 Interstitial pulmonary disease, unspecified: Secondary | ICD-10-CM | POA: Diagnosis not present

## 2020-05-03 NOTE — Patient Instructions (Signed)
Start reducing your prednisone by 10 mg [half tablet] every week until completed We will schedule you for high-resolution CT and pulmonary function test for evaluation of the lung Follow-up plan 1 to 2 months.

## 2020-05-03 NOTE — Progress Notes (Signed)
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  Diagnosed with COVID-19 on 02/08/2020.  Admitted to [facility] and treated with Decadron, 2 to 3 L oxygen.  He did not require intubation.  [facility] on [facility] and eventually went home.  Had a CTA on 8/14 which showed small PE in the right lower lobe. He was able to wean himself off oxygen but readmitted on 04/04/20 with worsening dyspnea, hypoxia. He was placed back on prednisone and discharged on 40 mg daily which he continues to now.  Continues on half liter supplemental oxygen at night Repeat CT showed resolution of PE and was discharged on prophylactic Lovenox which will end tomorrow.   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  No feather pillows or comforters.  No hot tub, Smoking history:He is a 30-pack-year smoker.  Quit in 1972.  Travel history: No significant travel history Relevant family history: No family history of lung disease   Outpatient Encounter Medications as of 05/03/2020  Medication Sig  . albuterol (VENTOLIN HFA) 108 (90 Base) MCG/ACT inhaler Inhale 2 puffs into the lungs every 4 (four) hours as needed for wheezing or shortness of breath.  . enoxaparin (LOVENOX) 40 MG/0.4ML injection Inject 0.4 mLs (40 mg total) into the skin daily.  . mirtazapine (REMERON) 7.5 MG tablet Take 1 tablet (7.5 mg total) by mouth at bedtime.  . Multiple Vitamins-Iron (MULTIVITAMIN/IRON PO) Take 1 tablet by mouth daily.  Marland Kitchen omeprazole (PRILOSEC) 40 MG capsule TAKE 1 CAPSULE EVERY DAY BEFORE BREAKFAST  . predniSONE (DELTASONE)  20 MG tablet Take 3 tablets (60 mg total) by mouth daily for 14 days, THEN 2.5 tablets (50 mg total) daily for 14 days, THEN 2 tablets (40 mg total) daily for 14 days.  . primidone (MYSOLINE) 50 MG tablet Take 150 mg by mouth daily with breakfast.  . sertraline (ZOLOFT) 50 MG tablet Take 50 mg by mouth every evening.  . sodium chloride (OCEAN) 0.65 % SOLN nasal spray Place 1 spray into both nostrils daily as needed for congestion.  . verapamil (CALAN) 120 MG tablet Take 120 mg by mouth daily.  . [DISCONTINUED] famotidine (PEPCID) 20 MG tablet Take 20 mg by mouth 2 (two) times daily.   No facility-administered encounter medications on file as of 05/03/2020.    Allergies as of 05/03/2020 - Review Complete 05/03/2020  Allergen Reaction Noted  . Nsaids  02/14/2020  . Acetaminophen-codeine Other (See Comments) 02/20/2016  . Ibuprofen Other (See Comments) 11/24/2013  . Ipratropium bromide Other (See Comments) 06/22/2015  . Meloxicam Other (See Comments) 11/18/2006  . Metronidazole Nausea And Vomiting 11/24/2013  . Other Other (See Comments) 11/24/2013  . Sulindac Other (See Comments) 11/24/2013    Past Medical History:  Diagnosis Date  . Atrial fibrillation ([place])   . COVID-19   . Hernia of abdominal cavity   . Renal disorder     Past Surgical History:  Procedure Laterality Date  . APPENDECTOMY    . BACK SURGERY    . HERNIA REPAIR    . SHOULDER OPEN ROTATOR CUFF REPAIR Bilateral  No family history on file.  Social History   Socioeconomic History  . Marital status: Married    Spouse name: Not on file  . Number of children: Not on file  . Years of education: Not on file  . Highest education level: Not on file  Occupational History  . Not on file  Tobacco Use  . Smoking status: Former Research scientist (life sciences)  . Smokeless tobacco: Never Used  Substance and Sexual Activity  . Alcohol use: Not Currently  . Drug use: Never  . Sexual activity: Not on file  Other Topics Concern  . Not on  file  Social History Narrative  . Not on file   Social Determinants of Health   Financial Resource Strain:   . Difficulty of Paying Living Expenses: Not on file  Food Insecurity:   . Worried About Charity fundraiser in the Last Year: Not on file  . Ran Out of Food in the Last Year: Not on file  Transportation Needs:   . Lack of Transportation (Medical): Not on file  . Lack of Transportation (Non-Medical): Not on file  Physical Activity:   . Days of Exercise per Week: Not on file  . Minutes of Exercise per Session: Not on file  Stress:   . Feeling of Stress : Not on file  Social Connections:   . Frequency of Communication with Friends and Family: Not on file  . Frequency of Social Gatherings with Friends and Family: Not on file  . Attends Religious Services: Not on file  . Active Member of Clubs or Organizations: Not on file  . Attends Archivist Meetings: Not on file  . Marital Status: Not on file  Intimate Partner Violence:   . Fear of Current or Ex-Partner: Not on file  . Emotionally Abused: Not on file  . Physically Abused: Not on file  . Sexually Abused: Not on file    Review of systems: Review of Systems  Constitutional: Negative for fever and chills.  HENT: Negative.   Eyes: Negative for blurred vision.  Respiratory: as per HPI  Cardiovascular: Negative for chest pain and palpitations.  Gastrointestinal: Negative for vomiting, diarrhea, blood per rectum. Genitourinary: Negative for dysuria, urgency, frequency and hematuria.  Musculoskeletal: Negative for myalgias, back pain and joint pain.  Skin: Negative for itching and rash.  Neurological: Negative for dizziness, tremors, focal weakness, seizures and loss of consciousness.  Endo/Heme/Allergies: Negative for environmental allergies.  Psychiatric/Behavioral: Negative for depression, suicidal ideas and hallucinations.  All other systems reviewed and are negative.  Physical Exam: Blood pressure 128/72,  pulse 72, temperature 98.6 F (37 C), temperature source Skin, height 5\' 10"  (1.778 m), weight 140 lb 6.4 oz (63.7 kg), SpO2 95 %. Gen:      No acute distress HEENT:  EOMI, sclera anicteric Neck:     No masses; no thyromegaly Lungs:    Bibasal crackles CV:         Regular rate and rhythm; no murmurs Abd:      + bowel sounds; soft, non-tender; no palpable masses, no distension Ext:    No edema; adequate peripheral perfusion Skin:      Warm and dry; no rash Neuro: alert and oriented x 3 Psych: normal mood and affect  Data Reviewed: Imaging: CTA 04/04/2020-no PE, multifocal airspace disease, small pleural effusion, traction bronchiectasis in the lower lobes.  I have reviewed the images personally.  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

## 2020-05-30 ENCOUNTER — Ambulatory Visit
Admission: RE | Admit: 2020-05-30 | Discharge: 2020-05-30 | Disposition: A | Discharge status: Home / Self Care | Payer: Medicare HMO | Source: Ambulatory Visit | Attending: Pulmonary Disease | Admitting: Pulmonary Disease

## 2020-05-30 DIAGNOSIS — J849 Interstitial pulmonary disease, unspecified: Secondary | ICD-10-CM

## 2020-06-17 ENCOUNTER — Other Ambulatory Visit (HOSPITAL_COMMUNITY)
Admission: RE | Admit: 2020-06-17 | Discharge: 2020-06-17 | Disposition: A | Discharge status: Home / Self Care | Payer: Medicare HMO | Source: Ambulatory Visit | Attending: Pulmonary Disease | Admitting: Pulmonary Disease

## 2020-06-17 DIAGNOSIS — Z20822 Contact with and (suspected) exposure to covid-19: Secondary | ICD-10-CM | POA: Diagnosis not present

## 2020-06-17 DIAGNOSIS — Z01812 Encounter for preprocedural laboratory examination: Secondary | ICD-10-CM | POA: Insufficient documentation

## 2020-06-17 LAB — SARS CORONAVIRUS 2 (TAT 6-24 HRS): SARS Coronavirus 2: NEGATIVE

## 2020-06-20 ENCOUNTER — Ambulatory Visit: Payer: Medicare HMO | Admitting: Pulmonary Disease

## 2020-06-20 ENCOUNTER — Encounter: Payer: Self-pay | Admitting: Pulmonary Disease

## 2020-06-20 ENCOUNTER — Other Ambulatory Visit: Payer: Self-pay

## 2020-06-20 ENCOUNTER — Ambulatory Visit (INDEPENDENT_AMBULATORY_CARE_PROVIDER_SITE_OTHER): Payer: Medicare HMO | Admitting: Pulmonary Disease

## 2020-06-20 VITALS — BP 142/78 | Temp 97.7°F | Ht 68.0 in | Wt 143.0 lb

## 2020-06-20 DIAGNOSIS — J849 Interstitial pulmonary disease, unspecified: Secondary | ICD-10-CM | POA: Diagnosis not present

## 2020-06-20 DIAGNOSIS — U099 Post covid-19 condition, unspecified: Secondary | ICD-10-CM

## 2020-06-20 LAB — PULMONARY FUNCTION TEST
DL/VA % pred: 84 %
DL/VA: 3.32 ml/min/mmHg/L
DLCO cor % pred: 53 %
DLCO cor: 12.18 ml/min/mmHg
DLCO unc % pred: 53 %
DLCO unc: 12.18 ml/min/mmHg
FEF 25-75 Pre: 3.43 L/sec
FEF2575-%Pred-Pre: 197 %
FEV1-%Pred-Pre: 83 %
FEV1-Pre: 2.16 L
FEV1FVC-%Pred-Pre: 123 %
FEV6-%Pred-Pre: 72 %
FEV6-Pre: 2.46 L
FEV6FVC-%Pred-Pre: 107 %
FVC-%Pred-Pre: 67 %
FVC-Pre: 2.46 L
Pre FEV1/FVC ratio: 88 %
Pre FEV6/FVC Ratio: 100 %
RV % pred: 54 %
RV: 1.4 L
TLC % pred: 58 %
TLC: 3.86 L

## 2020-06-20 NOTE — Progress Notes (Signed)
PFT done today. 

## 2020-06-20 NOTE — Progress Notes (Signed)
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  Diagnosed with COVID-19 on 02/08/2020.  Admitted to [facility] and treated with Decadron, 2 to 3 L oxygen.  He did not require intubation.  Discharged on 8/[facility] to [facility] and eventually went home.  Had a CTA on 8/14 which showed small PE in the right lower lobe. He was able to wean himself off oxygen but readmitted on 04/04/20 with worsening dyspnea, hypoxia. He was placed back on prednisone and discharged on 40 mg daily which he continues to now.  Continues on half liter supplemental oxygen at night Repeat CT showed resolution of PE and was discharged on prophylactic Lovenox which will end tomorrow.   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  No feather pillows or comforters.  No hot tub, Smoking history:He is a 30-pack-year smoker.  Quit in 1972.  Travel history: No significant travel history Relevant family history: No family history of lung disease  Interval history: Continues to do better with regard to his breathing Came off prednisone taper at the end of November 2021.  Of supplemental oxygen during daytime.  Continues on 0.5 L/min at night   Outpatient Encounter Medications as of 06/20/2020  Medication Sig  . mirtazapine (REMERON) 7.5 MG tablet Take 1 tablet (7.5 mg total) by mouth at bedtime.  . Multiple Vitamins-Iron (MULTIVITAMIN/IRON PO) Take 1 tablet by mouth daily.  Marland Kitchen omeprazole (PRILOSEC) 40 MG capsule TAKE 1 CAPSULE EVERY DAY BEFORE BREAKFAST  . primidone (MYSOLINE) 50 MG tablet Take 150 mg by mouth daily with  breakfast.  . sodium chloride (OCEAN) 0.65 % SOLN nasal spray Place 1 spray into both nostrils daily as needed for congestion.  . verapamil (CALAN) 120 MG tablet Take 120 mg by mouth daily.  Marland Kitchen albuterol (VENTOLIN HFA) 108 (90 Base) MCG/ACT inhaler Inhale 2 puffs into the lungs every 4 (four) hours as needed for wheezing or shortness of breath. (Patient not taking: Reported on 06/20/2020)  . [DISCONTINUED] enoxaparin (LOVENOX) 40 MG/0.4ML injection Inject 0.4 mLs (40 mg total) into the skin daily.  . [DISCONTINUED] sertraline (ZOLOFT) 50 MG tablet Take 50 mg by mouth every evening. (Patient not taking: Reported on 06/20/2020)   No facility-administered encounter medications on file as of 06/20/2020.   Physical Exam: Blood pressure (!) 142/78, temperature 97.7 F (36.5 C), temperature source Skin, height 5\' 8"  (1.727 m), weight 143 lb (64.9 kg), SpO2 96 %. Gen:      No acute distress HEENT:  EOMI, sclera anicteric Neck:     No masses; no thyromegaly Lungs:    Clear to auscultation bilaterally; normal respiratory effort CV:         Regular rate and rhythm; no murmurs Abd:      + bowel sounds; soft, non-tender; no palpable masses, no distension Ext:    No edema; adequate peripheral perfusion Skin:      Warm and dry; no rash Neuro: alert and oriented x 3 Psych: normal mood and affect  Data Reviewed: Imaging: CTA 04/04/2020-no PE, multifocal airspace disease, small pleural effusion, traction bronchiectasis in the lower lobes.  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 Coronary and aortic atherosclerosis. I have reviewed the images personally.  PFTs: 06/20/2020 FVC 2.46 [69%], FEV1 2.16 [83%], TLC 3.86 [58%], DLCO 12.18 [53%] Moderate restriction, moderate-severe diffusion defect.  Labs: CBC 04/09/2020-WBC 6.4, eos 0%  Assessment:  Post COVID-19 Making slow improvements Continue on supplemental oxygen at night for now Check overnight oximetry to see if he can  come off oxygen altogether.  Reviewed PFTs and CT scan showing moderate restriction and post Covid pneumonitis. Set expectations that recovery will be slow. Referral to pulmonary rehab in [place].  Plan/Recommendations: Overnight oximetry Pulmonary rehab referral  Marshell Garfinkel MD [facility] 06/20/2020, 12:04 PM  CC: Karleen Hampshire., MD

## 2020-06-20 NOTE — Patient Instructions (Signed)
I am glad you are starting to feel better.   I have reviewed his CT scan and PFTs which show improving post Covid inflammation.  We will order overnight oximetry on room air to see if he can come off oxygen Referred to pulmonary rehab at [facility]  Follow-up in 6 months with PFTs.

## 2020-06-21 ENCOUNTER — Telehealth: Payer: Self-pay | Admitting: Pulmonary Disease

## 2020-06-21 DIAGNOSIS — U099 Post covid-19 condition, unspecified: Secondary | ICD-10-CM

## 2020-06-21 DIAGNOSIS — J849 Interstitial pulmonary disease, unspecified: Secondary | ICD-10-CM

## 2020-06-21 NOTE — Telephone Encounter (Signed)
Called and spoke to pt. Pt states he is having soreness in his ribs. Advised pt it is likely intercostal soreness from his PFT. Pt states his breathing is fine, no complaints. However, pt does not want to do the full set of PFTs at his next visit. Pt is agreeable to a partial test but refuses to do the full hour test. Pt states it made his feel bad, fatigued, weak. Pt was seen on 12.13.21 and advised to follow up in 6 months with PFTs.   Dr. Vaughan Browner please advise. Thanks.

## 2020-06-21 NOTE — Telephone Encounter (Signed)
Can change follow-up PFT to limited spirometry and diffusion capacity.

## 2020-06-21 NOTE — Telephone Encounter (Signed)
Called and spoke with pt letting him know the info stated by Dr. Vaughan Browner and he verbalized understanding. New order has been placed showing spirometry and diffusion capacity only.nothing further needed.

## 2020-07-14 ENCOUNTER — Encounter: Payer: Self-pay | Admitting: Pulmonary Disease

## 2020-07-14 NOTE — Progress Notes (Signed)
Reviewed overnight oximetry dated 06/30/2020 Time of study 5 hours 20 minutes Low O2 sat of 81% Time spent less than 88% - 9 minutes  Please advise patient to continue oxygen at night.  Chilton Greathouse MD [facility] Please see [email or web address] for pager details.  07/14/2020, 5:13 PM

## 2020-07-15 NOTE — Progress Notes (Signed)
Called and spoke with Patient.  Dr. Matilde Bash results and recommendations given.  Understanding stated.  Nothing further at this time.

## 2020-08-23 ENCOUNTER — Telehealth: Payer: Self-pay | Admitting: Pulmonary Disease

## 2020-08-23 DIAGNOSIS — J849 Interstitial pulmonary disease, unspecified: Secondary | ICD-10-CM

## 2020-08-23 DIAGNOSIS — U099 Post covid-19 condition, unspecified: Secondary | ICD-10-CM

## 2020-08-23 NOTE — Telephone Encounter (Signed)
Called and spoke with pt and he stated that he is currently in rehab at [facility].  He stated that he is doing very well and he is hoping to get to go home soon.  He is wanting to know how long he will need to use the oxygen at home at night?  He stated that he does well with walking and does not get SHOB at all.  He is doing rehab x 3 days a week.  PM please advise on the oxygen use at night.  Thanks

## 2020-08-24 NOTE — Telephone Encounter (Signed)
If he is only on 0.5 lt and is improving then we can stop the nocturnal O2 now  Please order follow up ONO on room air in the next month

## 2020-08-24 NOTE — Telephone Encounter (Signed)
Called and spoke with pt letting him know the info stated by Dr. Vaughan Browner and let him know that it is okay for him to not wear O2 as long as he is improving and since he is only on 0.5L  Stated to pt that we were going to order ONO for him to have done on room air so we can see what it shows for the results and stated to  Him once we had the results back, as long as results were stable and showed that he did not need the O2, we would then place order to have O2 discontinued. Pt verbalized understanding. Order has been placed for ONO to be done. Nothing further needed.

## 2020-09-06 ENCOUNTER — Encounter: Payer: Self-pay | Admitting: Pulmonary Disease

## 2020-09-12 ENCOUNTER — Telehealth: Payer: Self-pay | Admitting: Pulmonary Disease

## 2020-09-12 NOTE — Telephone Encounter (Signed)
Pt had received ONO, took to [employer], was supposed to take to [facility] on [address]. Pt called [employer] and they are unable to find machine. [employer] is trying to get things straightened out for pt. Not sure if he needs to redo ONO.Please advise [phone]

## 2020-09-12 NOTE — Telephone Encounter (Signed)
Called and spoke with patient. He stated that he received the ONO from [facility] on 09/05/20 and returned it the next day. When he returned it, he returned it the wrong office ([employer]). [employer] was unable to find the machine the next day. [employer] has been in contact with [employer] to get the machine and the data off of it.   He wanted to know if he needed to complete another ONO. He stated that he has been sleeping without O2 at night for the past 2 weeks and has felt fine. He is currently enrolled into the cardiac rehab at [facility] and has been doing well. He was able to work out on [employer] today and his O2 sats was 97%.   Dr. Vaughan Browner, please advise. Thanks!

## 2020-09-12 NOTE — Telephone Encounter (Signed)
Pt stated that he dropped off a finger monitor for his oxygen @ [facility] off of [address] and they have misplaced it and stated that he does not have the monitor so he can take it to [facility] and he has been sleeping w/o o2 for the past 2 wks but has been doing fine and stated he has been doing rehab at [facility] that he wanted to update Dr. Vaughan Browner about. Pls regard [phone]

## 2020-09-12 NOTE — Telephone Encounter (Signed)
Attempted to call pt but unable to reach. Left message for him to return call. °

## 2020-09-13 NOTE — Telephone Encounter (Signed)
Pt stated that [employer] was able to locate his ONO device and he is going to take it to [employer] wanted to know should he drop it off with [employer] or if he is able to bring it to the office today (09/13/20). Pls regard [phone].

## 2020-09-13 NOTE — Telephone Encounter (Signed)
Pt aware to take machine back to [employer]

## 2020-09-13 NOTE — Telephone Encounter (Signed)
PCC's please advise.  

## 2020-09-16 ENCOUNTER — Telehealth: Payer: Self-pay | Admitting: Pulmonary Disease

## 2020-09-16 DIAGNOSIS — J849 Interstitial pulmonary disease, unspecified: Secondary | ICD-10-CM

## 2020-09-16 NOTE — Telephone Encounter (Signed)
Spoke with [employer] since the ONO not here yet after checking. They are going to fax it now. Will await fax.

## 2020-09-19 NOTE — Telephone Encounter (Signed)
ONO results received this afternoon via fax.  I have called Patient and made him aware ONO results have been received and Dr. Vaughan Browner will see them when he returns to office 09/20/20.

## 2020-09-19 NOTE — Telephone Encounter (Signed)
Lattie Haw, was the ONO received?

## 2020-09-19 NOTE — Telephone Encounter (Signed)
I have not seen ONO results on Patient.  I am unsure if Dr. Vaughan Browner has received ONO results from someone else working with him.  Message routed to Dr. Vaughan Browner to advise if ONO results received.

## 2020-09-20 NOTE — Telephone Encounter (Signed)
Called and spoke with pt letting him know the results of the ONO. Pt verbalized understanding.  Pt wanted to know if it was okay for [place] to discontinue his O2 based off of the results and also since it has been at least 3 weeks since he has worn the O2.  Dr. Vaughan Browner, please advise.

## 2020-09-20 NOTE — Telephone Encounter (Signed)
Overnight oximetry on room air dated 09/06/2020 Duration of study 3 hours 34 minutes Time spent less than 88% 0.2 minutes low O2 sat of 88% Oxygen desaturation index 19  His oxygen levels were okay and he does not need supplemental oxygen at night Report scanned into the computer  Marshell Garfinkel MD [facility] After 7:00 pm call Elink  [phone] 09/20/2020, 8:51 AM

## 2020-09-21 NOTE — Telephone Encounter (Signed)
Okay to place order for discontinuation of oxygen

## 2020-09-21 NOTE — Telephone Encounter (Signed)
Called and spoke with patient. He is aware Dr. Vaughan Browner is ok with the order to D/C oxygen. Will go ahead and place the order.    Nothing further needed at time of call.

## 2020-09-21 NOTE — Telephone Encounter (Signed)
Pt returning a phone call about getting o2 picked up. Pt can be reached at [phone]. Pt will be going to rehab at 1:15pm

## 2020-11-25 ENCOUNTER — Telehealth: Payer: Self-pay | Admitting: Pulmonary Disease

## 2020-11-25 DIAGNOSIS — U099 Post covid-19 condition, unspecified: Secondary | ICD-10-CM

## 2020-11-25 DIAGNOSIS — J849 Interstitial pulmonary disease, unspecified: Secondary | ICD-10-CM

## 2020-11-25 NOTE — Telephone Encounter (Signed)
Called and spoke with patient, order placed. Nothing further needed,

## 2020-11-25 NOTE — Telephone Encounter (Signed)
I called and spoke with patient regarding message. Patient stated that he has been doing rehab and he is feeling stronger and has more stamina. Also breathing has improved. Patient stated that when he sits down is when he is starts feeling heavy on his chest and wanted to let Dr. Vaughan Browner know. Patient is also requesting to do another CT if possible. Patient was to f/u in 6 months in 12/21 with PFTS. I made the OV with Dr. Vaughan Browner but patient refused to do PFT. He stated that it exhausted him and he almost fell out here in the office. I informed patient I will let Dr. Vaughan Browner know and see if he has any recs.  Dr. Vaughan Browner, please advise. Thanks!

## 2020-11-25 NOTE — Telephone Encounter (Signed)
Pt stated that his oxygen levels are in upper 90s but he stated he is feeling very tired and achy in chest and does not know if it is due to the exercise he performs he said he feels like he has to take a deep breathe when doing certain activities. Stated that when he mowes the grass he does not feel it but in other instances he does, stated has been ongoing for a few weeks. Pls regard; [phone]

## 2020-11-25 NOTE — Telephone Encounter (Signed)
Okay to order CT as it has been 6 months since his prior scan  Regarding the chest pressure on exertion  I would recommend that he check in with primary care doctor or heart doctor. He does have plaque build up in the blood vessels of the heart on CT and may need a check up.

## 2020-12-13 ENCOUNTER — Ambulatory Visit
Admission: RE | Admit: 2020-12-13 | Discharge: 2020-12-13 | Disposition: A | Discharge status: Home / Self Care | Payer: Medicare HMO | Source: Ambulatory Visit | Attending: Pulmonary Disease | Admitting: Pulmonary Disease

## 2020-12-13 DIAGNOSIS — U099 Post covid-19 condition, unspecified: Secondary | ICD-10-CM

## 2020-12-13 DIAGNOSIS — J849 Interstitial pulmonary disease, unspecified: Secondary | ICD-10-CM

## 2021-01-04 ENCOUNTER — Other Ambulatory Visit: Payer: Self-pay

## 2021-01-04 ENCOUNTER — Encounter: Payer: Self-pay | Admitting: Pulmonary Disease

## 2021-01-04 ENCOUNTER — Ambulatory Visit: Payer: Medicare HMO | Admitting: Pulmonary Disease

## 2021-01-04 VITALS — BP 120/72 | HR 68 | Ht 70.0 in | Wt 139.8 lb

## 2021-01-04 DIAGNOSIS — J849 Interstitial pulmonary disease, unspecified: Secondary | ICD-10-CM

## 2021-01-04 DIAGNOSIS — U099 Post covid-19 condition, unspecified: Secondary | ICD-10-CM | POA: Diagnosis not present

## 2021-01-04 NOTE — Patient Instructions (Signed)
I am glad you are doing well with regard to your breathing CT scan shows very minimal changes which I am not concerned about  Follow-up in clinic as needed.

## 2021-01-04 NOTE — Progress Notes (Signed)
George Sandoval    [id number]    May 14, 1941  Primary Care Physician:Furr, Cleone Slim., MD  Referring Physician: Karleen Hampshire., MD [address] [place],  [place] [address]  Chief complaint: Follow-up for post COVID-62  HPI: 81 year old with history of atrial fibrillation, GERD, prostate cancer, CKD, polycystic kidney disease, hypertension.   Diagnosed with COVID-19 on 02/08/2020.  Admitted to [facility] and treated with Decadron, 2 to 3 L oxygen.  He did not require intubation.  Discharged on 8/[facility] to [facility] and eventually went home.  Had a CTA on 8/14 which showed small PE in the right lower lobe. He was able to wean himself off oxygen but readmitted on 04/04/20 with worsening dyspnea, hypoxia. He was placed back on prednisone and discharged on 40 mg daily.  He was on a slow prednisone taper which completed November 2021. 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  No feather pillows or comforters.  No hot tub, Smoking history:He is a 30-pack-year smoker.  Quit in 1972.  Travel history: No significant travel history Relevant family history: No family history of lung disease  Interval history: Continues to improve.  He feels he is back to baseline.  Has started exercising and working in the yard, farm again.  Outpatient Encounter Medications as of 01/04/2021  Medication Sig   albuterol (VENTOLIN HFA) 108 (90 Base) MCG/ACT inhaler Inhale 2 puffs into the lungs every 4 (four) hours as needed for wheezing or shortness of breath.   mirtazapine (REMERON) 7.5 MG tablet Take 1 tablet (7.5 mg total) by mouth at bedtime.   Multiple Vitamins-Iron (MULTIVITAMIN/IRON PO) Take 1 tablet by mouth daily.   omeprazole  (PRILOSEC) 40 MG capsule TAKE 1 CAPSULE EVERY DAY BEFORE BREAKFAST   primidone (MYSOLINE) 50 MG tablet Take 150 mg by mouth daily with breakfast.   sodium chloride (OCEAN) 0.65 % SOLN nasal spray Place 1 spray into both nostrils daily as needed for congestion.   verapamil (CALAN) 120 MG tablet Take 120 mg by mouth daily.   No facility-administered encounter medications on file as of 01/04/2021.   Physical Exam: Blood pressure 120/72, pulse 68, height 5\' 10"  (1.778 m), weight 139 lb 12.8 oz (63.4 kg), SpO2 96 %. Gen:      No acute distress HEENT:  EOMI, sclera anicteric Neck:     No masses; no thyromegaly Lungs:    Clear to auscultation bilaterally; normal respiratory effort CV:         Regular rate and rhythm; no murmurs Abd:      + bowel sounds; soft, non-tender; no palpable masses, no distension Ext:    No edema; adequate peripheral perfusion Skin:      Warm and dry; no rash Neuro: alert and oriented x 3 Psych: normal mood and affect   Data Reviewed: Imaging: CTA 04/04/2020-no PE, multifocal airspace disease, small pleural effusion, traction bronchiectasis in the lower lobes.    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 Coronary and aortic atherosclerosis.  High-res CT 12/13/2020-mild peripheral predominant subpleural groundglass and reticular densities.  Renal stones, coronary atherosclerosis, calcification. I have reviewed the images personally.  PFTs: 06/20/2020 FVC 2.46 [69%], FEV1 2.16 [  83%], TLC 3.86 [58%], DLCO 12.18 [53%] Moderate restriction, moderate-severe diffusion defect.  Labs: CBC 04/09/2020-WBC 6.4, eos 0%  Overnight oximetry on room air dated 09/06/2020 Duration of study 3 hours 34 minutes Time spent less than 88% 0.2 minutes low O2 sat of 88% Oxygen desaturation index 19  Assessment:  Post COVID-19 Making slow improvements Off supplemental oxygen as his overnight oximetry and ambulatory O2 test are okay Has completed pulmonary rehab in  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

## 2021-01-05 ENCOUNTER — Encounter: Payer: Self-pay | Admitting: Pulmonary Disease

## 2021-11-28 IMAGING — CT CT CHEST HIGH RESOLUTION W/O CM
1 of 4 series · 14 of 32 positions shown, 18 images · non-contrast
Comparison: 05/30/2020 and 02/20/2020.

CLINICAL DATA: Chest tightness with deep inspiration. COVID
infection 02/15/2020. Prostate cancer.

EXAM:
CT CHEST WITHOUT CONTRAST
TECHNIQUE: Multidetector CT imaging of the chest was performed following the
standard protocol without intravenous contrast. High resolution
imaging of the lungs, as well as inspiratory and expiratory imaging,
was performed.

[Series 2: chest · axial · 0.75mm/px · z∈[-309,-7]mm · 14 of 169 slices shown, 18 images]
[im 9/169  mediastinal]
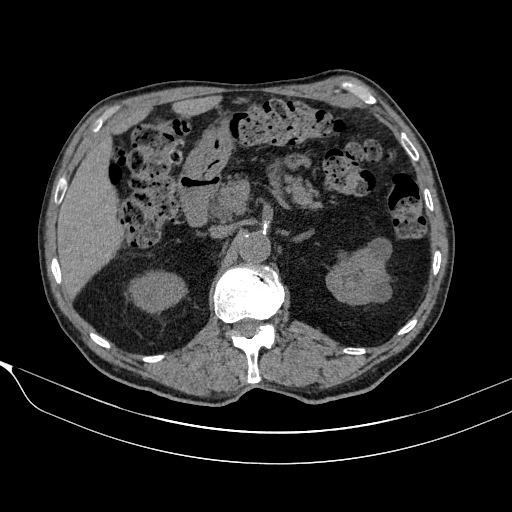
[im 9/169  lung]
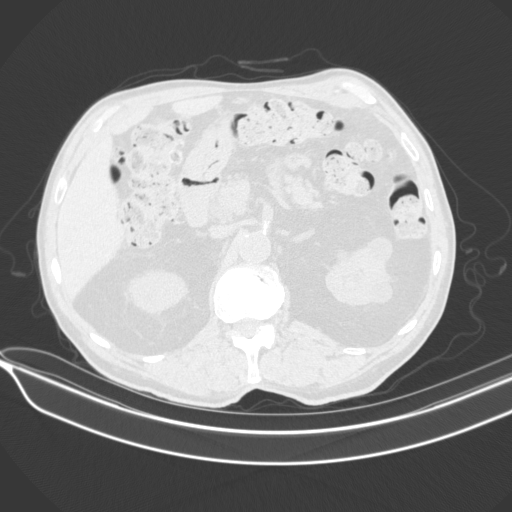
[im 27/169  lung]
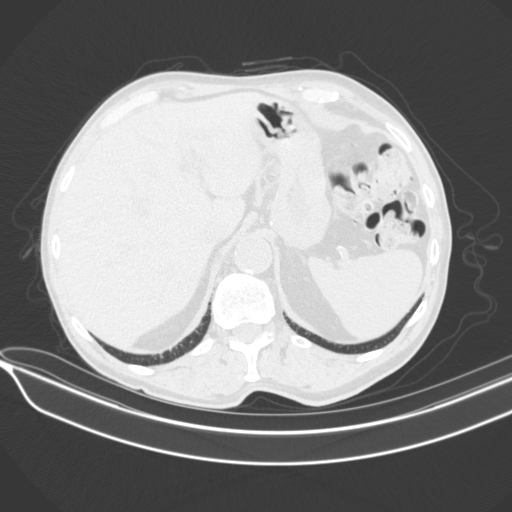
[im 36/169  lung]
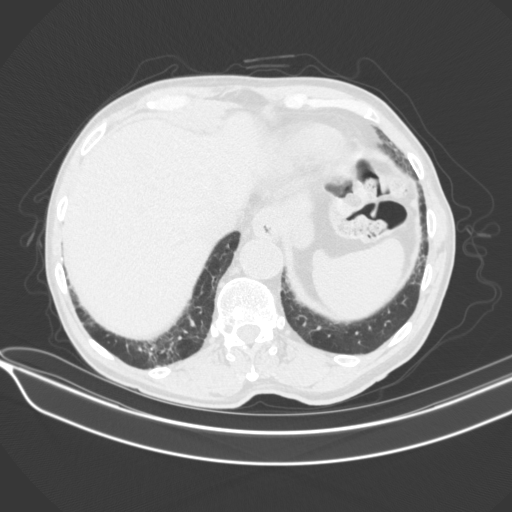
[im 54/169  lung]
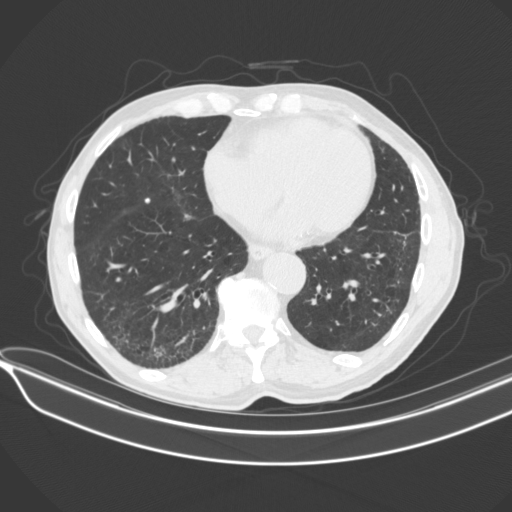
[im 62/169  mediastinal]
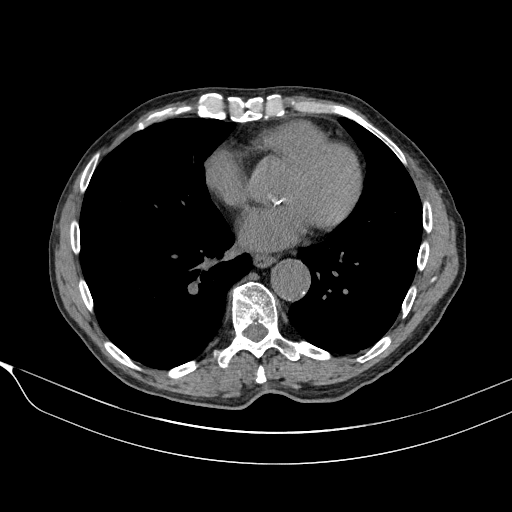
[im 62/169  lung]
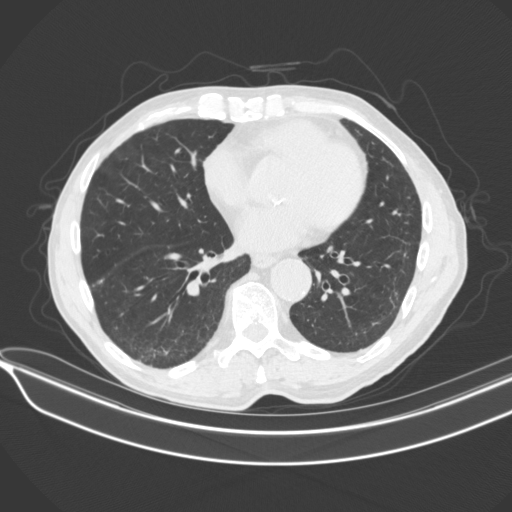
[im 71/169  lung]
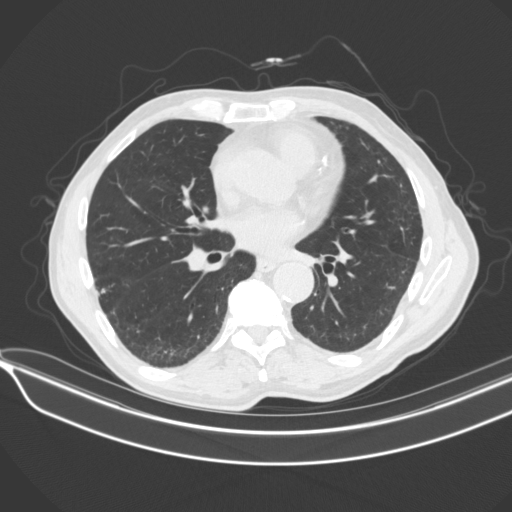
[im 81/169  lung]
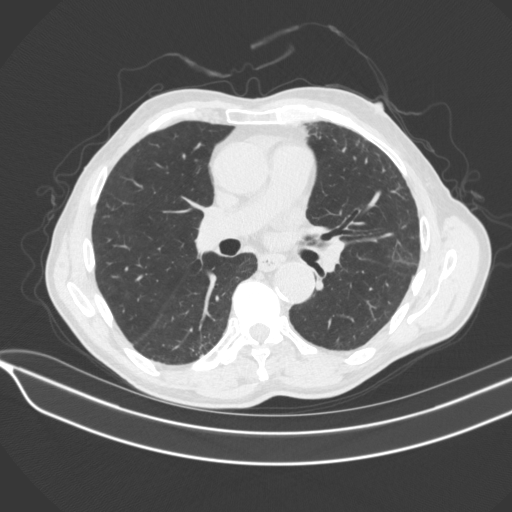
[im 85/169  lung]
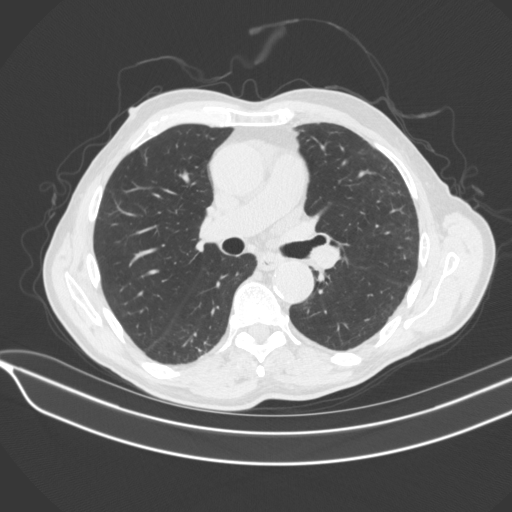
[im 98/169  mediastinal]
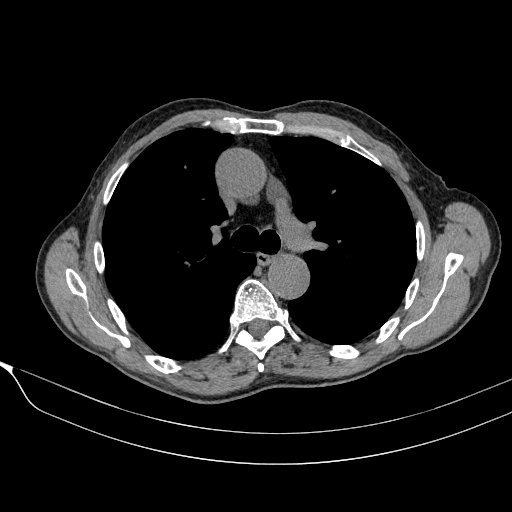
[im 98/169  lung]
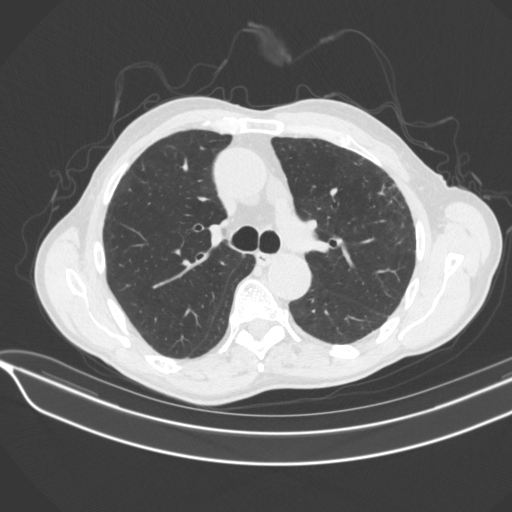
[im 107/169  lung]
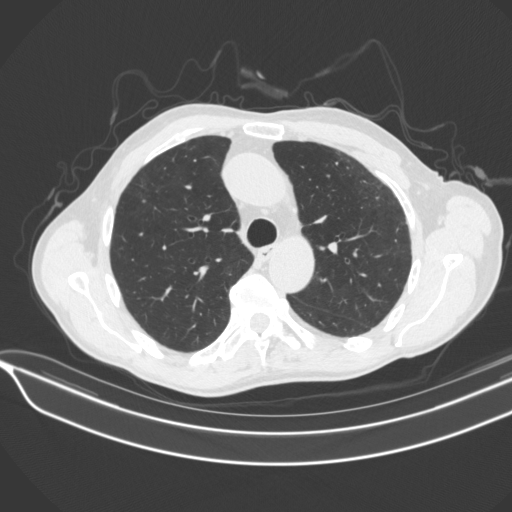
[im 124/169  lung]
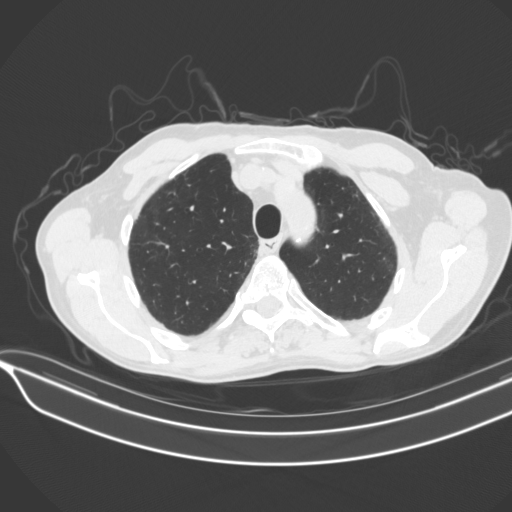
[im 133/169  lung]
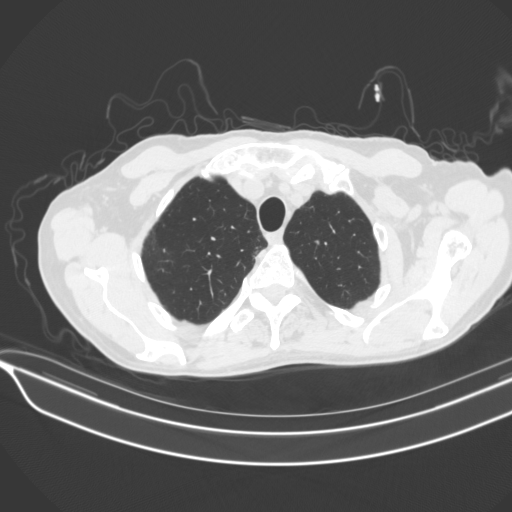
[im 142/169  mediastinal]
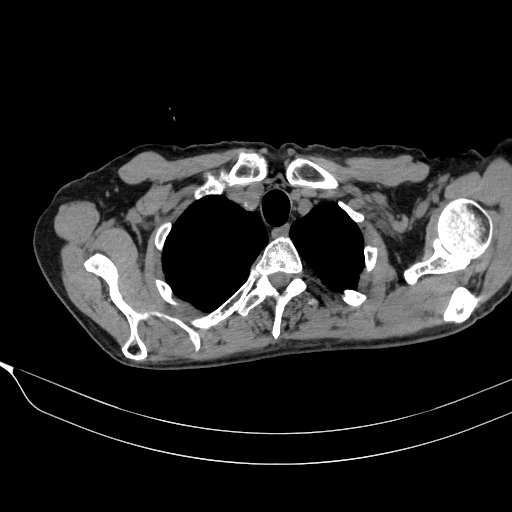
[im 142/169  lung]
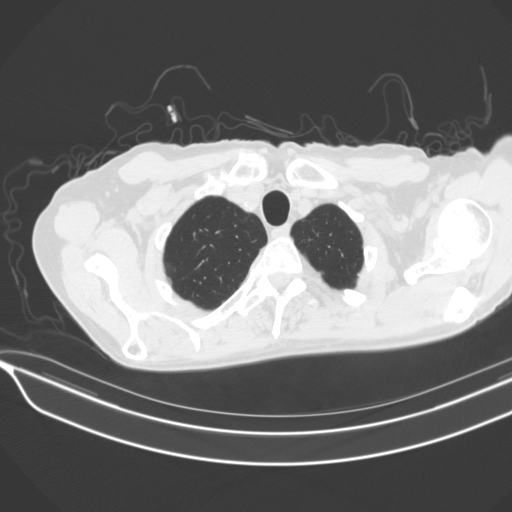
[im 160/169  lung]
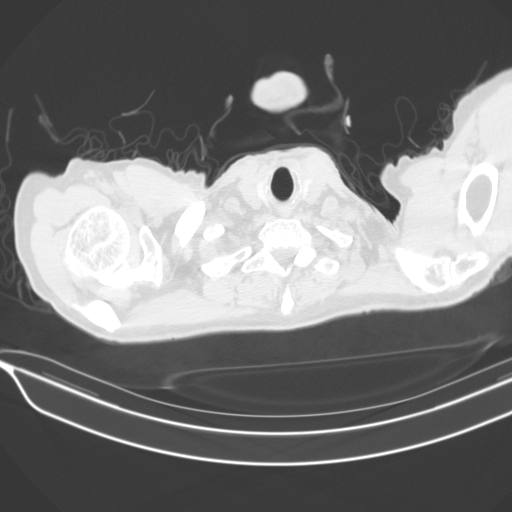

[14 of 32 positions shown; findings below may reference images not displayed]

FINDINGS: Cardiovascular: Atherosclerotic calcification of the aorta, aortic
valve and coronary arteries. Heart is at the upper limits of normal
in size. No pericardial effusion.

Mediastinum/Nodes: No pathologically enlarged mediastinal or
axillary lymph nodes. Hilar regions are difficult to definitively
evaluate without IV contrast. Esophagus is grossly unremarkable.

Lungs/Pleura: Centrilobular emphysema. Basilar predominant
subpleural reticular densities and ground-glass, similar to slightly
more organized than on 05/30/2020. 2 mm right middle lobe nodule
(7/97), unchanged from 02/20/2020 and considered benign. Calcified
granuloma in the inferior right middle lobe. No pleural fluid.
Airway is unremarkable. No air trapping.

Upper Abdomen: Visualized portions of the liver and right adrenal
gland are unremarkable. Slight nodular thickening of the left
adrenal gland. Low-attenuation lesions in the kidneys measure up to
4.4 cm on the right, the majority of which are incompletely
visualized. Stones in the left kidney. Visualized portions of the
spleen, pancreas, stomach and bowel are unremarkable with the
exception of a small hiatal hernia. Cholecystectomy. Incidental note
is made of a duodenal diverticulum, partially imaged.

Musculoskeletal: Degenerative changes in the spine. No worrisome
lytic or sclerotic lesions.
IMPRESSION: 1. Mild peripheral predominant subpleural ground-glass and reticular
densities. Post COVID 19 inflammatory fibrosis is favored.
2. Left renal stones.
3. Aortic atherosclerosis (OZ9GF-OY7.7). Coronary artery
calcification.
4.  Emphysema ([id number]-TW7.7).
